# Patient Record
Sex: Female | Born: 1960 | Race: White | Hispanic: No | Marital: Married | Smoking: Former smoker
Health system: Southern US, Community
[De-identification: ages and names within clinical notes are randomized; demographics above are authoritative.]

## PROBLEM LIST (undated history)

## (undated) DIAGNOSIS — D649 Anemia, unspecified: Secondary | ICD-10-CM

## (undated) DIAGNOSIS — T8859XA Other complications of anesthesia, initial encounter: Secondary | ICD-10-CM

## (undated) DIAGNOSIS — R112 Nausea with vomiting, unspecified: Secondary | ICD-10-CM

## (undated) DIAGNOSIS — T4145XA Adverse effect of unspecified anesthetic, initial encounter: Secondary | ICD-10-CM

## (undated) DIAGNOSIS — R1900 Intra-abdominal and pelvic swelling, mass and lump, unspecified site: Secondary | ICD-10-CM

## (undated) DIAGNOSIS — K631 Perforation of intestine (nontraumatic): Secondary | ICD-10-CM

## (undated) DIAGNOSIS — Z9889 Other specified postprocedural states: Secondary | ICD-10-CM

## (undated) DIAGNOSIS — K432 Incisional hernia without obstruction or gangrene: Secondary | ICD-10-CM

## (undated) DIAGNOSIS — R634 Abnormal weight loss: Secondary | ICD-10-CM

## (undated) DIAGNOSIS — IMO0001 Reserved for inherently not codable concepts without codable children: Secondary | ICD-10-CM

## (undated) DIAGNOSIS — E785 Hyperlipidemia, unspecified: Secondary | ICD-10-CM

## (undated) DIAGNOSIS — C801 Malignant (primary) neoplasm, unspecified: Secondary | ICD-10-CM

## (undated) DIAGNOSIS — Z5189 Encounter for other specified aftercare: Secondary | ICD-10-CM

## (undated) HISTORY — DX: Hyperlipidemia, unspecified: E78.5

## (undated) HISTORY — PX: APPENDECTOMY: SHX54

## (undated) HISTORY — DX: Incisional hernia without obstruction or gangrene: K43.2

## (undated) HISTORY — DX: Perforation of intestine (nontraumatic): K63.1

## (undated) HISTORY — DX: Malignant (primary) neoplasm, unspecified: C80.1

## (undated) HISTORY — DX: Intra-abdominal and pelvic swelling, mass and lump, unspecified site: R19.00

## (undated) HISTORY — PX: TONSILLECTOMY: SUR1361

## (undated) HISTORY — DX: Abnormal weight loss: R63.4

## (undated) HISTORY — PX: CHOLECYSTECTOMY: SHX55

## (undated) HISTORY — PX: MELANOMA EXCISION: SHX5266

---

## 1984-12-19 HISTORY — PX: TUBAL LIGATION: SHX77

## 1995-12-20 HISTORY — PX: OTHER SURGICAL HISTORY: SHX169

## 2005-12-02 ENCOUNTER — Other Ambulatory Visit: Admission: RE | Admit: 2005-12-02 | Discharge: 2005-12-02 | Payer: Self-pay | Admitting: Obstetrics and Gynecology

## 2007-02-22 ENCOUNTER — Ambulatory Visit: Payer: Self-pay | Admitting: Cardiology

## 2010-12-02 ENCOUNTER — Ambulatory Visit (HOSPITAL_COMMUNITY)
Admission: RE | Admit: 2010-12-02 | Discharge: 2010-12-02 | Payer: Self-pay | Source: Home / Self Care | Attending: Obstetrics & Gynecology | Admitting: Obstetrics & Gynecology

## 2010-12-08 ENCOUNTER — Ambulatory Visit (HOSPITAL_COMMUNITY)
Admission: RE | Admit: 2010-12-08 | Discharge: 2010-12-08 | Payer: Self-pay | Source: Home / Self Care | Attending: Gastroenterology | Admitting: Gastroenterology

## 2010-12-16 ENCOUNTER — Ambulatory Visit (HOSPITAL_COMMUNITY)
Admission: RE | Admit: 2010-12-16 | Discharge: 2010-12-16 | Payer: Self-pay | Source: Home / Self Care | Attending: General Surgery | Admitting: General Surgery

## 2011-02-28 LAB — LIPID PANEL
LDL Cholesterol: 143 mg/dL — ABNORMAL HIGH (ref 0–99)
Total CHOL/HDL Ratio: 4.1 RATIO
Triglycerides: 111 mg/dL (ref <150)
VLDL: 22 mg/dL (ref 0–40)

## 2011-02-28 LAB — DIFFERENTIAL
Eosinophils Absolute: 0.1 10*3/uL (ref 0.0–0.7)
Eosinophils Relative: 3 % (ref 0–5)
Lymphs Abs: 1.5 10*3/uL (ref 0.7–4.0)
Monocytes Relative: 12 % (ref 3–12)

## 2011-02-28 LAB — CBC
MCH: 29.9 pg (ref 26.0–34.0)
MCHC: 33.6 g/dL (ref 30.0–36.0)
Platelets: 262 10*3/uL (ref 150–400)
RBC: 4.41 MIL/uL (ref 3.87–5.11)
RDW: 12.6 % (ref 11.5–15.5)

## 2011-02-28 LAB — TYPE AND SCREEN: Antibody Screen: NEGATIVE

## 2011-02-28 LAB — COMPREHENSIVE METABOLIC PANEL
ALT: 42 U/L — ABNORMAL HIGH (ref 0–35)
AST: 117 U/L — ABNORMAL HIGH (ref 0–37)
AST: 25 U/L (ref 0–37)
Albumin: 4.3 g/dL (ref 3.5–5.2)
Calcium: 9.6 mg/dL (ref 8.4–10.5)
Calcium: 9.5 mg/dL (ref 8.4–10.5)
Chloride: 106 mEq/L (ref 96–112)
Creatinine, Ser: 0.89 mg/dL (ref 0.4–1.2)
GFR calc Af Amer: >60 mL/min (ref >60)
GFR calc Af Amer: >60 mL/min (ref >60)
Sodium: 140 mEq/L (ref 135–145)
Sodium: 140 mEq/L (ref 135–145)
Total Bilirubin: 0.8 mg/dL (ref 0.3–1.2)
Total Protein: 6.8 g/dL (ref 6.0–8.3)

## 2011-02-28 LAB — AMYLASE: Amylase: 52 U/L (ref 0–105)

## 2011-02-28 LAB — SURGICAL PCR SCREEN: MRSA, PCR: NEGATIVE

## 2011-02-28 LAB — PROTIME-INR: INR: 1.02 (ref 0.00–1.49)

## 2011-05-06 NOTE — Assessment & Plan Note (Signed)
[facility]                            CARDIOLOGY OFFICE NOTE   [name], Clayton                     MRN:          [id number]  DATE:02/22/2007                            DOB:          May 06, 1961    I was asked by Dr. Marcelle Overlie to evaluate Stacie Clayton, a delightful 50-  year-old married white female, mother of 4, all younger than 30, for  history of hyperlipidemia.   She was evaluated in 2005 by Dr. Oneta Rack in [place], [place].  Her  cholesterol at that time was 271, triglycerides 155, HDL 47, LDL was  193.  She had a stress echo, which her husband and her remember as being  normal.   She had a recent lipo science profile by Dr. Marcelle Overlie, which showed her  particles to be borderline high at 1347.  Her small LDL particles were  low at 385.  LDL particle size was large.  Her large HDL was low risk  level, her total cholesterol was 222.  Her LDL was 148.  Her HDL was 55.  Her triglycerides were 97.   Her other risk factors are sedentary lifestyle and obesity.  She is a  remote smoker, quit in 1985.  She has no history of any other risk  factors.   FAMILY HISTORY:  Is really not a factor she thought, because her father  had his 1st MI at 35.  I explained that to her today.   She is currently totally asymptomatic.  She walks on occasion.   PAST MEDICAL HISTORY:  She has had a tubal ligation in 1986, a tubal  reversal in 1995.   CURRENT MEDICATIONS:  1. Celexa 40 mg a day.  2. Angelia hormone daily.  3. Aspirin 81 mg a day.   SHE HAS NO KNOWN DRUG ALLERGIES.   SOCIAL HISTORY:  She is married.  She has 4 children.  Her husband is  with her today.  Her oldest child is 42!   REVIEW OF SYSTEMS:  Fully questioned, and is positive only for history  of premature atrial beats in the past, occasional headaches.  Otherwise  negative.   She does give a history of malignant melanoma on her back in 1999.  She  is postmenopausal.   EXAMINATION:   She is very pleasant.  Her blood pressure is 126/79.  Her pulse is 64 and regular.  She is 5  feet 2 inches.  Weighs 195 pounds.  HEENT:  Normocephalic and atraumatic.  PERRLA.  Extraocular movements  intact.  Sclerae are clear.  Facial symmetry is normal.  Dentition is  satisfactory.  Carotid upstrokes are equal bilaterally without bruits.  There is no  JVD.  Thyroid is not enlarged.  Trachea is midline.  LUNGS:  Clear.  HEART:  Reveals a soft S1 and S2.  PMI could not be appreciated.  ABDOMINAL EXAM:  Soft with good bowel sounds.  No obvious organomegaly.  EXTREMITIES:  No edema.  Pulses are brisk.  NEUROLOGIC:  Exam in intact.  SKIN:  Unremarkable.  MUSCULOSKELETAL:  Unremarkable.   Electrocardiogram is normal.  ASSESSMENT AND PLAN:  I had about a 20-minute with Ms. Row and her  husband.  She clearly needs TLC or therapeutic lifestyle changes.  I  have recommended she start walking 3 hours per week, lose 4 pounds a  month, and see [place] back in 6 months.  At this time, she does not need  pharmacological therapy.  I am a little disconcerted by the fact there  is a significant discrepancy between her lipids in the past and now.  We  will perform a lipid profile in 6 months as a tiebreaker.     Thomas C. Daleen Squibb, MD, [facility]  Electronically Signed    TCW/MedQ  DD: 02/22/2007  DT: 02/22/2007  Job #: [id number]   cc:   [facility]. Marcelle Overlie, M.D.

## 2011-05-30 ENCOUNTER — Other Ambulatory Visit (INDEPENDENT_AMBULATORY_CARE_PROVIDER_SITE_OTHER): Payer: Self-pay | Admitting: General Surgery

## 2011-05-30 ENCOUNTER — Ambulatory Visit
Admission: RE | Admit: 2011-05-30 | Discharge: 2011-05-30 | Disposition: A | Discharge status: Home / Self Care | Payer: BC Managed Care – PPO | Source: Ambulatory Visit | Attending: General Surgery | Admitting: General Surgery

## 2011-05-30 DIAGNOSIS — R1013 Epigastric pain: Secondary | ICD-10-CM

## 2011-05-30 LAB — COMPREHENSIVE METABOLIC PANEL
Alkaline Phosphatase: 93 U/L (ref 39–117)
BUN: 16 mg/dL (ref 6–23)
Creat: 0.79 mg/dL (ref 0.50–1.10)
Glucose, Bld: 88 mg/dL (ref 70–99)
Sodium: 141 mEq/L (ref 135–145)
Total Bilirubin: 1.4 mg/dL — ABNORMAL HIGH (ref 0.3–1.2)

## 2011-05-30 LAB — CBC WITH DIFFERENTIAL/PLATELET
Basophils Relative: 1 % (ref 0–1)
Eosinophils Absolute: 0.1 10*3/uL (ref 0.0–0.7)
Eosinophils Relative: 3 % (ref 0–5)
Hemoglobin: 12.9 g/dL (ref 12.0–15.0)
MCH: 30.4 pg (ref 26.0–34.0)
MCHC: 33.5 g/dL (ref 30.0–36.0)
Monocytes Absolute: 0.4 10*3/uL (ref 0.1–1.0)
Monocytes Relative: 9 % (ref 3–12)
Neutrophils Relative %: 60 % (ref 43–77)

## 2011-05-30 LAB — LIPASE: Lipase: 57 U/L (ref 0–75)

## 2011-05-30 MED ORDER — IOHEXOL 300 MG/ML  SOLN: 100.0000 mL | Freq: Once | INTRAMUSCULAR | Status: AC | PRN

## 2011-06-13 ENCOUNTER — Ambulatory Visit (HOSPITAL_COMMUNITY)
Admission: RE | Admit: 2011-06-13 | Discharge: 2011-06-13 | Disposition: A | Discharge status: Home / Self Care | Payer: BC Managed Care – PPO | Source: Ambulatory Visit | Attending: Gastroenterology | Admitting: Gastroenterology

## 2011-06-13 DIAGNOSIS — Z01812 Encounter for preprocedural laboratory examination: Secondary | ICD-10-CM | POA: Insufficient documentation

## 2011-06-13 LAB — TYPE AND SCREEN
ABO/RH(D): A NEG
Antibody Screen: NEGATIVE

## 2011-06-13 LAB — CBC
Platelets: 231 10*3/uL (ref 150–400)
RBC: 4.42 MIL/uL (ref 3.87–5.11)
RDW: 12.5 % (ref 11.5–15.5)
WBC: 5 10*3/uL (ref 4.0–10.5)

## 2011-06-13 LAB — COMPREHENSIVE METABOLIC PANEL
Albumin: 4.2 g/dL (ref 3.5–5.2)
BUN: 15 mg/dL (ref 6–23)
Calcium: 10.8 mg/dL — ABNORMAL HIGH (ref 8.4–10.5)
GFR calc Af Amer: >60 mL/min (ref >60)
Glucose, Bld: 93 mg/dL (ref 70–99)
Sodium: 142 mEq/L (ref 135–145)
Total Protein: 7.4 g/dL (ref 6.0–8.3)

## 2011-06-13 LAB — DIFFERENTIAL
Basophils Relative: 1 % (ref 0–1)
Eosinophils Absolute: 0.2 10*3/uL (ref 0.0–0.7)
Eosinophils Relative: 3 % (ref 0–5)
Neutrophils Relative %: 57 % (ref 43–77)

## 2011-06-14 ENCOUNTER — Ambulatory Visit (HOSPITAL_COMMUNITY)
Admission: RE | Admit: 2011-06-14 | Discharge: 2011-06-14 | Disposition: A | Discharge status: Home / Self Care | Payer: BC Managed Care – PPO | Source: Ambulatory Visit | Attending: Gastroenterology | Admitting: Gastroenterology

## 2011-06-14 ENCOUNTER — Ambulatory Visit (HOSPITAL_COMMUNITY): Payer: BC Managed Care – PPO

## 2011-06-14 DIAGNOSIS — Z7902 Long term (current) use of antithrombotics/antiplatelets: Secondary | ICD-10-CM | POA: Insufficient documentation

## 2011-06-14 DIAGNOSIS — R1013 Epigastric pain: Secondary | ICD-10-CM | POA: Insufficient documentation

## 2011-06-14 DIAGNOSIS — Z9089 Acquired absence of other organs: Secondary | ICD-10-CM | POA: Insufficient documentation

## 2011-06-14 DIAGNOSIS — Z01812 Encounter for preprocedural laboratory examination: Secondary | ICD-10-CM | POA: Insufficient documentation

## 2011-06-14 DIAGNOSIS — Z79899 Other long term (current) drug therapy: Secondary | ICD-10-CM | POA: Insufficient documentation

## 2011-06-14 DIAGNOSIS — R109 Unspecified abdominal pain: Secondary | ICD-10-CM | POA: Insufficient documentation

## 2011-06-14 HISTORY — PX: ERCP W/ SPHICTEROTOMY: SHX1523

## 2011-06-19 ENCOUNTER — Inpatient Hospital Stay (HOSPITAL_COMMUNITY)
Admission: EM | Admit: 2011-06-19 | Discharge: 2011-07-10 | DRG: 583 | Disposition: A | Discharge status: Home / Self Care | Payer: BC Managed Care – PPO | Attending: Surgery | Admitting: Surgery

## 2011-06-19 DIAGNOSIS — D649 Anemia, unspecified: Secondary | ICD-10-CM | POA: Diagnosis present

## 2011-06-19 DIAGNOSIS — E46 Unspecified protein-calorie malnutrition: Secondary | ICD-10-CM | POA: Diagnosis present

## 2011-06-19 DIAGNOSIS — Y9210 Unspecified residential institution as the place of occurrence of the external cause: Secondary | ICD-10-CM | POA: Diagnosis present

## 2011-06-19 DIAGNOSIS — E785 Hyperlipidemia, unspecified: Secondary | ICD-10-CM | POA: Diagnosis present

## 2011-06-19 DIAGNOSIS — K560 Paralytic ileus: Secondary | ICD-10-CM | POA: Diagnosis present

## 2011-06-19 DIAGNOSIS — K631 Perforation of intestine (nontraumatic): Secondary | ICD-10-CM | POA: Diagnosis present

## 2011-06-19 DIAGNOSIS — IMO0002 Reserved for concepts with insufficient information to code with codable children: Principal | ICD-10-CM | POA: Diagnosis present

## 2011-06-19 DIAGNOSIS — K651 Peritoneal abscess: Secondary | ICD-10-CM | POA: Diagnosis present

## 2011-06-19 DIAGNOSIS — E876 Hypokalemia: Secondary | ICD-10-CM | POA: Diagnosis not present

## 2011-06-19 HISTORY — PX: OTHER SURGICAL HISTORY: SHX169

## 2011-06-20 ENCOUNTER — Emergency Department (HOSPITAL_COMMUNITY): Payer: BC Managed Care – PPO

## 2011-06-20 ENCOUNTER — Encounter (HOSPITAL_COMMUNITY): Payer: Self-pay | Admitting: Radiology

## 2011-06-20 DIAGNOSIS — K269 Duodenal ulcer, unspecified as acute or chronic, without hemorrhage or perforation: Secondary | ICD-10-CM

## 2011-06-20 LAB — COMPREHENSIVE METABOLIC PANEL
ALT: 21 U/L (ref 0–35)
Alkaline Phosphatase: 65 U/L (ref 39–117)
CO2: 25 mEq/L (ref 19–32)
Chloride: 93 mEq/L — ABNORMAL LOW (ref 96–112)
GFR calc Af Amer: >60 mL/min (ref >60)
GFR calc non Af Amer: >60 mL/min (ref >60)
Glucose, Bld: 133 mg/dL — ABNORMAL HIGH (ref 70–99)
Potassium: 2.9 mEq/L — ABNORMAL LOW (ref 3.5–5.1)
Sodium: 132 mEq/L — ABNORMAL LOW (ref 135–145)
Total Bilirubin: 0.5 mg/dL (ref 0.3–1.2)
Total Protein: 5.9 g/dL — ABNORMAL LOW (ref 6.0–8.3)

## 2011-06-20 LAB — PROTIME-INR
INR: 1.25 (ref 0.00–1.49)
Prothrombin Time: 16 seconds — ABNORMAL HIGH (ref 11.6–15.2)

## 2011-06-20 LAB — CBC
Hemoglobin: 12.5 g/dL (ref 12.0–15.0)
RBC: 4.07 MIL/uL (ref 3.87–5.11)
WBC: 16.3 10*3/uL — ABNORMAL HIGH (ref 4.0–10.5)

## 2011-06-20 LAB — DIFFERENTIAL
Basophils Relative: 1 % (ref 0–1)
Eosinophils Relative: 0 % (ref 0–5)
Monocytes Relative: 10 % (ref 3–12)
Neutrophils Relative %: 86 % — ABNORMAL HIGH (ref 43–77)
WBC Morphology: INCREASED

## 2011-06-20 MED ORDER — IOHEXOL 300 MG/ML  SOLN
100.0000 mL | Freq: Once | INTRAMUSCULAR | Status: AC | PRN
  Administered 2011-06-20: 100 mL via INTRAVENOUS

## 2011-06-21 LAB — BASIC METABOLIC PANEL
BUN: 13 mg/dL (ref 6–23)
Calcium: 7.6 mg/dL — ABNORMAL LOW (ref 8.4–10.5)
GFR calc non Af Amer: >60 mL/min (ref >60)
Glucose, Bld: 131 mg/dL — ABNORMAL HIGH (ref 70–99)

## 2011-06-21 LAB — CBC
HCT: 26.4 % — ABNORMAL LOW (ref 36.0–46.0)
Hemoglobin: 9 g/dL — ABNORMAL LOW (ref 12.0–15.0)
MCH: 29.7 pg (ref 26.0–34.0)
MCHC: 34.1 g/dL (ref 30.0–36.0)
MCV: 87.1 fL (ref 78.0–100.0)

## 2011-06-22 ENCOUNTER — Inpatient Hospital Stay (HOSPITAL_COMMUNITY): Payer: BC Managed Care – PPO

## 2011-06-22 LAB — BASIC METABOLIC PANEL
BUN: 17 mg/dL (ref 6–23)
BUN: 18 mg/dL (ref 6–23)
Calcium: 7.3 mg/dL — ABNORMAL LOW (ref 8.4–10.5)
Calcium: 7.3 mg/dL — ABNORMAL LOW (ref 8.4–10.5)
Chloride: 103 mEq/L (ref 96–112)
Creatinine, Ser: <0.47 mg/dL — ABNORMAL LOW (ref 0.50–1.10)
Glucose, Bld: 129 mg/dL — ABNORMAL HIGH (ref 70–99)
Glucose, Bld: 260 mg/dL — ABNORMAL HIGH (ref 70–99)
Potassium: 7.4 mEq/L (ref 3.5–5.1)
Potassium: 3.7 mEq/L (ref 3.5–5.1)
Sodium: 135 mEq/L (ref 135–145)
Sodium: 132 mEq/L — ABNORMAL LOW (ref 135–145)

## 2011-06-22 LAB — GLUCOSE, CAPILLARY: Glucose-Capillary: 122 mg/dL — ABNORMAL HIGH (ref 70–99)

## 2011-06-22 LAB — CBC
HCT: 18.6 % — ABNORMAL LOW (ref 36.0–46.0)
HCT: 19.8 % — ABNORMAL LOW (ref 36.0–46.0)
Hemoglobin: 6.1 g/dL — CL (ref 12.0–15.0)
Hemoglobin: 7.1 g/dL — ABNORMAL LOW (ref 12.0–15.0)
Hemoglobin: 7.4 g/dL — ABNORMAL LOW (ref 12.0–15.0)
MCH: 30.5 pg (ref 26.0–34.0)
MCH: 31.2 pg (ref 26.0–34.0)
MCHC: 33.9 g/dL (ref 30.0–36.0)
MCHC: 34.3 g/dL (ref 30.0–36.0)
MCHC: 35.9 g/dL (ref 30.0–36.0)
MCV: 89.4 fL (ref 78.0–100.0)
MCV: 89 fL (ref 78.0–100.0)
Platelets: 319 10*3/uL (ref 150–400)
RDW: 14.3 % (ref 11.5–15.5)
RDW: 14.6 % (ref 11.5–15.5)
RDW: 14.7 % (ref 11.5–15.5)
WBC: 21.5 10*3/uL — ABNORMAL HIGH (ref 4.0–10.5)
WBC: 22.2 10*3/uL — ABNORMAL HIGH (ref 4.0–10.5)

## 2011-06-22 LAB — BODY FLUID CULTURE

## 2011-06-22 LAB — MAGNESIUM: Magnesium: 1.8 mg/dL (ref 1.5–2.5)

## 2011-06-22 LAB — PHOSPHORUS
Phosphorus: 1.4 mg/dL — ABNORMAL LOW (ref 2.3–4.6)
Phosphorus: 5.7 mg/dL — ABNORMAL HIGH (ref 2.3–4.6)

## 2011-06-23 ENCOUNTER — Inpatient Hospital Stay (HOSPITAL_COMMUNITY): Payer: BC Managed Care – PPO

## 2011-06-23 LAB — GLUCOSE, CAPILLARY
Glucose-Capillary: 102 mg/dL — ABNORMAL HIGH (ref 70–99)
Glucose-Capillary: 94 mg/dL (ref 70–99)

## 2011-06-23 LAB — COMPREHENSIVE METABOLIC PANEL
AST: 27 U/L (ref 0–37)
Albumin: 1.4 g/dL — ABNORMAL LOW (ref 3.5–5.2)
Alkaline Phosphatase: 94 U/L (ref 39–117)
BUN: 13 mg/dL (ref 6–23)
CO2: 28 mEq/L (ref 19–32)
Chloride: 101 mEq/L (ref 96–112)
Creatinine, Ser: <0.47 mg/dL — ABNORMAL LOW (ref 0.50–1.10)
Potassium: 3.2 mEq/L — ABNORMAL LOW (ref 3.5–5.1)
Total Bilirubin: 0.4 mg/dL (ref 0.3–1.2)

## 2011-06-23 LAB — CBC
MCH: 31.2 pg (ref 26.0–34.0)
MCHC: 35.1 g/dL (ref 30.0–36.0)
Platelets: 323 10*3/uL (ref 150–400)
Platelets: 267 10*3/uL (ref 150–400)
RBC: 3.14 MIL/uL — ABNORMAL LOW (ref 3.87–5.11)
RDW: 14.6 % (ref 11.5–15.5)
WBC: 21.9 10*3/uL — ABNORMAL HIGH (ref 4.0–10.5)
WBC: 17.8 10*3/uL — ABNORMAL HIGH (ref 4.0–10.5)

## 2011-06-23 LAB — DIFFERENTIAL
Basophils Absolute: 0.2 10*3/uL — ABNORMAL HIGH (ref 0.0–0.1)
Basophils Relative: 1 % (ref 0–1)
Eosinophils Absolute: 0.2 10*3/uL (ref 0.0–0.7)
Lymphocytes Relative: 8 % — ABNORMAL LOW (ref 12–46)
Monocytes Absolute: 0.9 10*3/uL (ref 0.1–1.0)
Neutrophils Relative %: 86 % — ABNORMAL HIGH (ref 43–77)

## 2011-06-23 LAB — TRIGLYCERIDES: Triglycerides: 174 mg/dL — ABNORMAL HIGH (ref <150)

## 2011-06-23 LAB — VANCOMYCIN, TROUGH: Vancomycin Tr: 6.1 ug/mL — ABNORMAL LOW (ref 10.0–20.0)

## 2011-06-23 LAB — PREALBUMIN: Prealbumin: 8.8 mg/dL — ABNORMAL LOW (ref 17.0–34.0)

## 2011-06-24 LAB — TYPE AND SCREEN
Unit division: 0
Unit division: 0

## 2011-06-24 LAB — COMPREHENSIVE METABOLIC PANEL
Albumin: 1.5 g/dL — ABNORMAL LOW (ref 3.5–5.2)
BUN: 9 mg/dL (ref 6–23)
CO2: 28 mEq/L (ref 19–32)
Chloride: 101 mEq/L (ref 96–112)
Creatinine, Ser: <0.47 mg/dL — ABNORMAL LOW (ref 0.50–1.10)
Total Bilirubin: 0.7 mg/dL (ref 0.3–1.2)

## 2011-06-24 LAB — CBC
HCT: 24.2 % — ABNORMAL LOW (ref 36.0–46.0)
MCHC: 36.4 g/dL — ABNORMAL HIGH (ref 30.0–36.0)
RDW: 14.8 % (ref 11.5–15.5)

## 2011-06-24 LAB — GLUCOSE, CAPILLARY
Glucose-Capillary: 115 mg/dL — ABNORMAL HIGH (ref 70–99)
Glucose-Capillary: 100 mg/dL — ABNORMAL HIGH (ref 70–99)

## 2011-06-24 LAB — PREPARE FRESH FROZEN PLASMA

## 2011-06-24 LAB — PROTIME-INR: INR: 1.1 (ref 0.00–1.49)

## 2011-06-24 LAB — PHOSPHORUS: Phosphorus: 2.8 mg/dL (ref 2.3–4.6)

## 2011-06-24 LAB — MAGNESIUM: Magnesium: 1.9 mg/dL (ref 1.5–2.5)

## 2011-06-25 LAB — BASIC METABOLIC PANEL
Chloride: 102 mEq/L (ref 96–112)
Creatinine, Ser: <0.47 mg/dL — ABNORMAL LOW (ref 0.50–1.10)
Potassium: 3.4 mEq/L — ABNORMAL LOW (ref 3.5–5.1)

## 2011-06-25 LAB — GLUCOSE, CAPILLARY
Glucose-Capillary: 106 mg/dL — ABNORMAL HIGH (ref 70–99)
Glucose-Capillary: 99 mg/dL (ref 70–99)

## 2011-06-25 LAB — ANAEROBIC CULTURE

## 2011-06-25 LAB — CBC
Platelets: 280 10*3/uL (ref 150–400)
RDW: 14.7 % (ref 11.5–15.5)
WBC: 14.5 10*3/uL — ABNORMAL HIGH (ref 4.0–10.5)

## 2011-06-25 LAB — VANCOMYCIN, TROUGH: Vancomycin Tr: 13.9 ug/mL (ref 10.0–20.0)

## 2011-06-26 LAB — BASIC METABOLIC PANEL
Chloride: 101 mEq/L (ref 96–112)
Creatinine, Ser: <0.47 mg/dL — ABNORMAL LOW (ref 0.50–1.10)

## 2011-06-26 LAB — CBC
MCHC: 34.3 g/dL (ref 30.0–36.0)
MCV: 87.7 fL (ref 78.0–100.0)
Platelets: 346 10*3/uL (ref 150–400)
RDW: 14.4 % (ref 11.5–15.5)
WBC: 14.4 10*3/uL — ABNORMAL HIGH (ref 4.0–10.5)

## 2011-06-27 ENCOUNTER — Inpatient Hospital Stay (HOSPITAL_COMMUNITY): Payer: BC Managed Care – PPO

## 2011-06-27 DIAGNOSIS — E876 Hypokalemia: Secondary | ICD-10-CM

## 2011-06-27 LAB — DIFFERENTIAL
Basophils Absolute: 0 10*3/uL (ref 0.0–0.1)
Eosinophils Absolute: 0.3 10*3/uL (ref 0.0–0.7)
Eosinophils Relative: 2 % (ref 0–5)
Monocytes Absolute: 0.1 10*3/uL (ref 0.1–1.0)
Neutrophils Relative %: 84 % — ABNORMAL HIGH (ref 43–77)

## 2011-06-27 LAB — COMPREHENSIVE METABOLIC PANEL
ALT: 31 U/L (ref 0–35)
Albumin: 1.6 g/dL — ABNORMAL LOW (ref 3.5–5.2)
Alkaline Phosphatase: 182 U/L — ABNORMAL HIGH (ref 39–117)
Calcium: 7.9 mg/dL — ABNORMAL LOW (ref 8.4–10.5)
Potassium: 3.2 mEq/L — ABNORMAL LOW (ref 3.5–5.1)
Sodium: 137 mEq/L (ref 135–145)
Total Protein: 5 g/dL — ABNORMAL LOW (ref 6.0–8.3)

## 2011-06-27 LAB — CBC
Hemoglobin: 8.1 g/dL — ABNORMAL LOW (ref 12.0–15.0)
MCH: 30.5 pg (ref 26.0–34.0)
MCHC: 34 g/dL (ref 30.0–36.0)
Platelets: 452 10*3/uL — ABNORMAL HIGH (ref 150–400)
RDW: 14.4 % (ref 11.5–15.5)

## 2011-06-27 MED ORDER — IOHEXOL 300 MG/ML  SOLN
240.0000 mL | Freq: Once | INTRAMUSCULAR | Status: AC | PRN
  Administered 2011-06-27: 240 mL

## 2011-06-27 MED ORDER — IOHEXOL 300 MG/ML  SOLN
80.0000 mL | Freq: Once | INTRAMUSCULAR | Status: AC | PRN
  Administered 2011-06-27: 80 mL via INTRAVENOUS

## 2011-06-28 ENCOUNTER — Inpatient Hospital Stay (HOSPITAL_COMMUNITY): Payer: BC Managed Care – PPO

## 2011-06-28 DIAGNOSIS — E46 Unspecified protein-calorie malnutrition: Secondary | ICD-10-CM

## 2011-06-28 LAB — CBC
HCT: 24.9 % — ABNORMAL LOW (ref 36.0–46.0)
MCV: 89.2 fL (ref 78.0–100.0)
RDW: 14.1 % (ref 11.5–15.5)
WBC: 15.6 10*3/uL — ABNORMAL HIGH (ref 4.0–10.5)

## 2011-06-28 LAB — BASIC METABOLIC PANEL
BUN: 7 mg/dL (ref 6–23)
CO2: 27 mEq/L (ref 19–32)
Chloride: 99 mEq/L (ref 96–112)
Creatinine, Ser: <0.47 mg/dL — ABNORMAL LOW (ref 0.50–1.10)

## 2011-06-28 NOTE — H&P (Signed)
Stacie Clayton, Stacie Clayton              ACCOUNT NO.:  [id number]  MEDICAL RECORD NO.:  [id number]  LOCATION:  2306                         FACILITY:  [facility]  PHYSICIAN:  Almond Lint, MD       DATE OF BIRTH:  Aug 22, 1961  DATE OF ADMISSION:  06/19/2011 DATE OF DISCHARGE:  06/20/2011                             HISTORY & PHYSICAL   CHIEF COMPLAINT:  Duodenal perforation.  HISTORY OF PRESENT ILLNESS:  Ms.  Clayton is a 50 year old female who had a laparoscopic cholecystectomy back in December.  Since that time she has been having several episodes of right upper quadrant pain.  Dr. Dwain Sarna, primary surgeon.  They discussed everything with Dr. Ewing Schlein on doing ERCP on Tuesday.  She started having some pain after the ERCP on Tuesday and saw Dr. Ewing Schlein on Wednesday.  The plan had been if the pain did not go away by Thursday, to get a CT scan but by Thursday morning she was feeling a lot better.  Then, Thursday night the pain started back and begriming slowly worsening over the weekend.  The husband's anesthesiologist on-call Saturday to Sunday morning.  She does not want to good emergency department for left, at that time she got back, she was feeling a lot worse.  Last night she just came to the emergency department.  The patient is quite ill.  She has not had nausea, vomiting through this time, but has had some diarrhea.  She has not had anything to relieve the pain.  She did try taking some Vicodin and she has had from left over, and this did not relieve the pain.  The pain is worse when she moves.  She denies any other symptoms.  She denies fevers or chills.  She denies other symptoms.  PAST MEDICAL HISTORY:  Significant for no chronic illnesses.  SURGICAL HISTORY:  Laparoscopic cholecystectomy in December 2011, tubal ligation and tubal ligation reversal.  FAMILY HISTORY:  Noncontributory.  SOCIAL HISTORY:  She does not use tobacco, alcohol, or drugs.  She is married.  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  REVIEW OF SYSTEMS:  Otherwise negative x 11.  MEDICATIONS:  Alertec, calcium plus D, and Estrace.  ALLERGIES:  Known drug allergies.  PHYSICAL EXAMINATION:  VITAL SIGNS:  Temperature 98.6, heart rate 102, blood pressure 118/72, respiratory rate 21, and sats 96%. HEENT:  She is normocephalic and atraumatic.  Sclerae are anicteric. NECK:  Supple.  No lymphadenopathy.  No thyromegaly. HEART:  Regular rhythm.  She is tachycardic.  No murmurs, rubs, or gallops. LUNGS:  Clear to auscultation bilaterally. ABDOMEN:  Soft, distended.  Bowel sounds present.  She has tender right side greater than left with no guarding, but she has gotten significant amount of pain medication.  MUSCULOSKELETAL:  No gross deformities. SKIN:  She looks pale. NEURO:  She is very sleepy. PSYCH:  Mood, affect, and decision making appear normal.  LABORATORY DATA:  White count 16.3, hemoglobin 12.5, hematocrit 35.4, platelet count of 144,000.  Sodium 132, potassium 2.9, chloride 93, CO2 of 25, BUN 14, creatinine 0.59, glucose 133, bilirubin 0.5, AST 22, ALT 21, alk phos 65, and albumin 2.1.  CT scan significant for gas and  fluid around her kidney and retroperitoneum and some free air included in the peritoneal cavity, it was appears to have a small bowel obstruction with significantly dilated loops of bowel in the upper abdomen.  IMPRESSION:  Stacie Clayton is a 50 year old female with duodenal perforation after an ERCP, also with SIRS, given IV fluids, IV antibiotics, OR for exploratory laparotomy in her bowel perforation, she will be n.p.o., typed and screened, and coags.  Surgery was discussed with the patient and her husband and they agree to proceed.     Almond Lint, MD     FB/MEDQ  D:  06/20/2011  T:  06/20/2011  Job:  [id number]  Electronically Signed by Almond Lint MD on 06/28/2011 02:04:01 PM

## 2011-06-28 NOTE — Op Note (Signed)
[name], OZBUN              ACCOUNT NO.:  [id number]  MEDICAL RECORD NO.:  [id number]  LOCATION:  2306                         FACILITY:  [facility]  PHYSICIAN:  Almond Lint, MD       DATE OF BIRTH:  1961-11-20  DATE OF PROCEDURE:  06/20/2011 DATE OF DISCHARGE:  06/20/2011                              OPERATIVE REPORT   PREOPERATIVE DIAGNOSIS:  Duodenal perforation.  POSTOPERATIVE DIAGNOSIS:  Duodenal perforation.  PROCEDURE:  Exploratory laparotomy and repair/patch of duodenal perforation.  SURGEON:  Almond Lint, MD  ASSISTANT:  Troy Sine. Dwain Sarna, MD  ANESTHESIA:  General and local.  FINDINGS:  Retroperitoneal perforation of the duodenum with bile and succus and an abscess behind the cecum.  The abscess broke through the retroperitoneum and the right lower quadrant with extension of fluid into the pelvis.  SPECIMEN:  Cultures to Microbiology.  ESTIMATED BLOOD LOSS:  250 mL.  COMPLICATIONS:  None known.  INDICATIONS:  Stacie Clayton is a 50 year old female who had a laparoscopic cholecystectomy back in December 2011.  She has had intermittent episodes of right upper quadrant pain.  She developed several severe episodes of right upper quadrant pain and underwent an EGD 5 days prior to admission.  She has pain that night which resolved over the next 48 hours.  The pain recurred over the weekend and got to where it was so severe that she was brought to the emergency department.  CT scan confirmed leakage of contrast and air in the retroperitoneum and the pelvis and she was taken to the OR emergently for exploratory laparotomy.  PROCEDURE IN DETAIL:  [name] was identified in the holding area and taken to the operating room where she was placed supine on the operating room table.  General anesthesia was induced.  NG tube and Foley catheter were placed.  She had 2 L of bilious output from her stomach.  Her abdomen was prepped and draped in sterile fashion.   Time-out was performed according to surgical safety check list.  When all was correct, we continued.  A midline incision was made in the supraumbilical location.  The incision was carried out through the subcutaneous tissues with the cautery.  The midline was incised with the cautery and fascial incision was extended to the length of the incision. Immediately peritoneally, we needed larger incisions, so this was continued with the knife below the umbilicus.  Cautery was used to extend out the incision as well.  The Bookwalter retractor was placed for assistance with visualization.  There was bilious material pouring out of what appeared to be the cecum initially.  Once the cecum and small bowel was freed up, it was apparent that the bile was coming out of the retroperitoneum and that the cecum was intact.  The bowel was freed up from the pelvis where there were multiple adhesions.  The bowel was run and there was no evidence of other bowel injuries.  At this point, the duodenum was Kocherized.  There was some bleeding from one of the jejunal branch into the SMV which was oversewn.  The hole was found in the posterior duodenum and was approximately 1 cm in diameter.  The edges  were not clean.  Four 2-0 silk sutures were placed across the defect in the duodenum.  Through these, we were able to tie down but one of them was bridging through a speck of a gap and the duodenum looked quite friable, so this one was not tied down.  A tightened omentum was laid over the top of this and this was tied down over the hole.  The abdomen was then copiously irrigated.  There were 16 L of saline used to irrigate the abdomen.  Two #19 Blake drains were placed, one in the retroduodenal location and one in the right paracolic gutter.  This extends down into the pelvis.  The fascia was then closed using running #1 looped PDS sutures.  The umbilicus was stapled together and then a wound VAC was placed in the  remainder of the incision.  This was done after irrigating the skin.  The patient was awakened from anesthesia and taken to PACU in stable condition.  Needle, sponge, and instrument counts were correct x2.     Almond Lint, MD     FB/MEDQ  D:  06/20/2011  T:  06/20/2011  Job:  [id number]  Electronically Signed by Almond Lint MD on 06/28/2011 02:04:12 PM

## 2011-06-29 LAB — BASIC METABOLIC PANEL
BUN: 8 mg/dL (ref 6–23)
Calcium: 8.5 mg/dL (ref 8.4–10.5)
Creatinine, Ser: <0.47 mg/dL — ABNORMAL LOW (ref 0.50–1.10)

## 2011-06-29 LAB — CBC
HCT: 25 % — ABNORMAL LOW (ref 36.0–46.0)
MCH: 30.5 pg (ref 26.0–34.0)
MCHC: 34 g/dL (ref 30.0–36.0)
MCV: 89.6 fL (ref 78.0–100.0)
RDW: 14 % (ref 11.5–15.5)

## 2011-06-30 LAB — COMPREHENSIVE METABOLIC PANEL
AST: 14 U/L (ref 0–37)
Albumin: 2 g/dL — ABNORMAL LOW (ref 3.5–5.2)
BUN: 9 mg/dL (ref 6–23)
Calcium: 8.5 mg/dL (ref 8.4–10.5)
Creatinine, Ser: <0.47 mg/dL — ABNORMAL LOW (ref 0.50–1.10)

## 2011-06-30 LAB — MAGNESIUM: Magnesium: 2.2 mg/dL (ref 1.5–2.5)

## 2011-06-30 LAB — PHOSPHORUS: Phosphorus: 3.8 mg/dL (ref 2.3–4.6)

## 2011-07-02 LAB — COMPREHENSIVE METABOLIC PANEL
ALT: 16 U/L (ref 0–35)
AST: 14 U/L (ref 0–37)
Albumin: 2.1 g/dL — ABNORMAL LOW (ref 3.5–5.2)
CO2: 28 mEq/L (ref 19–32)
Calcium: 8.6 mg/dL (ref 8.4–10.5)
Chloride: 105 mEq/L (ref 96–112)
Sodium: 138 mEq/L (ref 135–145)

## 2011-07-03 ENCOUNTER — Inpatient Hospital Stay (HOSPITAL_COMMUNITY): Payer: BC Managed Care – PPO

## 2011-07-03 LAB — ANAEROBIC CULTURE

## 2011-07-03 MED ORDER — IOHEXOL 300 MG/ML  SOLN
100.0000 mL | Freq: Once | INTRAMUSCULAR | Status: AC | PRN
  Administered 2011-07-03: 100 mL via INTRAVENOUS

## 2011-07-04 LAB — DIFFERENTIAL
Basophils Absolute: 0.1 10*3/uL (ref 0.0–0.1)
Basophils Relative: 1 % (ref 0–1)
Eosinophils Absolute: 0.3 10*3/uL (ref 0.0–0.7)
Eosinophils Relative: 4 % (ref 0–5)
Lymphs Abs: 1.5 10*3/uL (ref 0.7–4.0)
Neutrophils Relative %: 61 % (ref 43–77)

## 2011-07-04 LAB — TRIGLYCERIDES: Triglycerides: 109 mg/dL (ref <150)

## 2011-07-04 LAB — COMPREHENSIVE METABOLIC PANEL
ALT: 13 U/L (ref 0–35)
AST: 12 U/L (ref 0–37)
Albumin: 2.1 g/dL — ABNORMAL LOW (ref 3.5–5.2)
CO2: 29 mEq/L (ref 19–32)
Chloride: 102 mEq/L (ref 96–112)
Creatinine, Ser: <0.47 mg/dL — ABNORMAL LOW (ref 0.50–1.10)
Potassium: 3.5 mEq/L (ref 3.5–5.1)
Sodium: 138 mEq/L (ref 135–145)
Total Bilirubin: 0.2 mg/dL — ABNORMAL LOW (ref 0.3–1.2)

## 2011-07-04 LAB — CBC
Platelets: 623 10*3/uL — ABNORMAL HIGH (ref 150–400)
RBC: 2.73 MIL/uL — ABNORMAL LOW (ref 3.87–5.11)
RDW: 13.9 % (ref 11.5–15.5)
WBC: 6.6 10*3/uL (ref 4.0–10.5)

## 2011-07-04 LAB — CHOLESTEROL, TOTAL: Cholesterol: 121 mg/dL (ref 0–200)

## 2011-07-04 LAB — MAGNESIUM: Magnesium: 2.1 mg/dL (ref 1.5–2.5)

## 2011-07-04 LAB — PREALBUMIN: Prealbumin: 14 mg/dL — ABNORMAL LOW (ref 17.0–34.0)

## 2011-07-04 LAB — PHOSPHORUS: Phosphorus: 3.7 mg/dL (ref 2.3–4.6)

## 2011-07-05 LAB — CBC
MCHC: 32.4 g/dL (ref 30.0–36.0)
Platelets: 606 10*3/uL — ABNORMAL HIGH (ref 150–400)
RDW: 13.8 % (ref 11.5–15.5)
WBC: 7.1 10*3/uL (ref 4.0–10.5)

## 2011-07-06 LAB — CULTURE, ROUTINE-ABSCESS

## 2011-07-06 LAB — COMPREHENSIVE METABOLIC PANEL
AST: 16 U/L (ref 0–37)
Albumin: 2.2 g/dL — ABNORMAL LOW (ref 3.5–5.2)
Alkaline Phosphatase: 85 U/L (ref 39–117)
BUN: 10 mg/dL (ref 6–23)
Chloride: 103 mEq/L (ref 96–112)
Potassium: 3.6 mEq/L (ref 3.5–5.1)
Total Bilirubin: 0.2 mg/dL — ABNORMAL LOW (ref 0.3–1.2)
Total Protein: 5.8 g/dL — ABNORMAL LOW (ref 6.0–8.3)

## 2011-07-07 LAB — COMPREHENSIVE METABOLIC PANEL
ALT: 9 U/L (ref 0–35)
Alkaline Phosphatase: 92 U/L (ref 39–117)
BUN: 11 mg/dL (ref 6–23)
CO2: 26 mEq/L (ref 19–32)
Calcium: 8.8 mg/dL (ref 8.4–10.5)
GFR calc Af Amer: >60 mL/min (ref >60)
GFR calc non Af Amer: >60 mL/min (ref >60)
Glucose, Bld: 106 mg/dL — ABNORMAL HIGH (ref 70–99)
Potassium: 3.6 mEq/L (ref 3.5–5.1)
Sodium: 138 mEq/L (ref 135–145)
Total Protein: 6.1 g/dL (ref 6.0–8.3)

## 2011-07-07 LAB — MAGNESIUM: Magnesium: 2 mg/dL (ref 1.5–2.5)

## 2011-07-08 ENCOUNTER — Inpatient Hospital Stay (HOSPITAL_COMMUNITY): Payer: BC Managed Care – PPO

## 2011-07-08 MED ORDER — IOHEXOL 300 MG/ML  SOLN
80.0000 mL | Freq: Once | INTRAMUSCULAR | Status: AC | PRN
  Administered 2011-07-08: 80 mL via INTRAVENOUS

## 2011-07-14 ENCOUNTER — Encounter (INDEPENDENT_AMBULATORY_CARE_PROVIDER_SITE_OTHER): Payer: Self-pay

## 2011-07-14 ENCOUNTER — Ambulatory Visit (INDEPENDENT_AMBULATORY_CARE_PROVIDER_SITE_OTHER): Payer: BC Managed Care – PPO | Admitting: General Surgery

## 2011-07-14 ENCOUNTER — Encounter (INDEPENDENT_AMBULATORY_CARE_PROVIDER_SITE_OTHER): Payer: Self-pay | Admitting: General Surgery

## 2011-07-14 VITALS — Temp 97.9°F

## 2011-07-14 DIAGNOSIS — K631 Perforation of intestine (nontraumatic): Secondary | ICD-10-CM | POA: Insufficient documentation

## 2011-07-14 DIAGNOSIS — K319 Disease of stomach and duodenum, unspecified: Secondary | ICD-10-CM

## 2011-07-14 NOTE — Progress Notes (Signed)
Stacie Clayton is a 50 y.o. female.    Chief Complaint  Patient presents with  . Other    HPI HPI Pt is doing well since going home.  No fevers/chills.  Has another week of cipro.  Is eating better and diarrhea is resolved.  Her energy level is improving.  She has been doing dressing changes 2 times/day.  Her drain output is slowing down.    Past Medical History  Diagnosis Date  . Weight loss   . Hyperlipidemia     normal now but has had in the past...per pt    Past Surgical History  Procedure Date  . Tubal reversal     tubal reversal  . Melanoma excision     back  . Cholecystectomy   . Tubal ligation 1986  . Ercp w/ sphicterotomy 06/14/11  . Repair of duodenal perforation Perforation from ERCP    Family History  Problem Relation Age of Onset  . Cancer Mother     lung  . Heart disease Father     heart attack    Social History History  Substance Use Topics  . Smoking status: Former Smoker    Quit date: 07/13/1986  . Smokeless tobacco: Not on file  . Alcohol Use: No    No Known Allergies  Current Outpatient Prescriptions  Medication Sig Dispense Refill  . ciprofloxacin (CIPRO) 500 MG/5ML (10%) suspension Take by mouth 2 (two) times daily.        Marland Kitchen HYDROcodone-acetaminophen (LORTAB) 10-500 MG per tablet Take 1 tablet by mouth as needed.        . pantoprazole (PROTONIX) 40 MG tablet Take 40 mg by mouth 2 (two) times daily.        Marland Kitchen BABY ASPIRIN PO Take by mouth.        . cetirizine (ZYRTEC) 10 MG tablet Take 10 mg by mouth daily.        . Nutritional Supplements (ESTROVEN ENERGY) CAPS Take by mouth.          Review of Systems Review of Systems  All other systems reviewed and are negative.    Physical Exam Physical Exam  Constitutional: She appears well-developed and well-nourished. No distress.       Well groomed, wearing makeup  HENT:  Head: Normocephalic and atraumatic.  Eyes: Conjunctivae are normal. Pupils are equal, round, and reactive to  light. No scleral icterus.  Neck: Normal range of motion. Neck supple.  Cardiovascular: Normal rate.   Respiratory: Effort normal. No respiratory distress. She exhibits no tenderness.  GI: Soft. She exhibits no distension. There is no tenderness. There is no rebound and no guarding.       Surgical drain medially located is still murky Two IR drains serous, but higher output.  Skin: Skin is warm and dry. No rash noted. She is not diaphoretic. No erythema. No pallor.  Psychiatric: She has a normal mood and affect. Her behavior is normal. Judgment and thought content normal.     Temperature 97.9 F (36.6 C).  Assessment/Plan  Duodenal perforation with R retroperitoneal abscesses. CT next week Leave drains for now.   Surgical drain is still murky Other 2 drains still with 20 mL or more/day.      Halcyon Heck 07/14/2011, 5:27 PM

## 2011-07-14 NOTE — Assessment & Plan Note (Signed)
CT next week Leave drains for now.   Surgical drain is still murky Other 2 drains still with 20 mL or more/day.

## 2011-07-14 NOTE — Patient Instructions (Signed)
Change to once daily wet to dry dressing changes.

## 2011-07-19 ENCOUNTER — Encounter (INDEPENDENT_AMBULATORY_CARE_PROVIDER_SITE_OTHER): Payer: Self-pay

## 2011-07-20 ENCOUNTER — Ambulatory Visit
Admission: RE | Admit: 2011-07-20 | Discharge: 2011-07-20 | Disposition: A | Discharge status: Home / Self Care | Payer: BC Managed Care – PPO | Source: Ambulatory Visit | Attending: General Surgery | Admitting: General Surgery

## 2011-07-20 DIAGNOSIS — K631 Perforation of intestine (nontraumatic): Secondary | ICD-10-CM

## 2011-07-20 MED ORDER — IOHEXOL 300 MG/ML  SOLN
100.0000 mL | Freq: Once | INTRAMUSCULAR | Status: AC | PRN
  Administered 2011-07-20: 100 mL via INTRAVENOUS

## 2011-07-22 ENCOUNTER — Other Ambulatory Visit (INDEPENDENT_AMBULATORY_CARE_PROVIDER_SITE_OTHER): Payer: Self-pay

## 2011-07-22 ENCOUNTER — Ambulatory Visit (INDEPENDENT_AMBULATORY_CARE_PROVIDER_SITE_OTHER): Payer: BC Managed Care – PPO | Admitting: General Surgery

## 2011-07-22 DIAGNOSIS — IMO0002 Reserved for concepts with insufficient information to code with codable children: Secondary | ICD-10-CM

## 2011-07-22 DIAGNOSIS — K219 Gastro-esophageal reflux disease without esophagitis: Secondary | ICD-10-CM

## 2011-07-22 DIAGNOSIS — K631 Perforation of intestine (nontraumatic): Secondary | ICD-10-CM

## 2011-07-22 DIAGNOSIS — K319 Disease of stomach and duodenum, unspecified: Secondary | ICD-10-CM

## 2011-07-22 MED ORDER — HYDROCODONE-ACETAMINOPHEN 10-500 MG PO TABS: 1.0000 | ORAL_TABLET | ORAL | Status: DC | PRN

## 2011-07-22 MED ORDER — PANTOPRAZOLE SODIUM 40 MG PO TBEC: 40.0000 mg | DELAYED_RELEASE_TABLET | Freq: Two times a day (BID) | ORAL | Status: DC

## 2011-07-22 MED ORDER — HYDROCODONE-ACETAMINOPHEN 5-325 MG PO TABS: 1.0000 | ORAL_TABLET | Freq: Four times a day (QID) | ORAL | Status: AC | PRN

## 2011-07-22 NOTE — Progress Notes (Signed)
Stacie Clayton is a 50 y.o. female.    Chief Complaint  Patient presents with  . Other    long term f/u check abd drains    HPI HPI Pt had episode of burning epigastric pain.  She called in and talked to Dr. Magnus Ivan who advised her to take pepcid. This resolved the pain.  She has 2 days of cipro left.  She is not having fevers/chills.  Her energy is improving.  Her appetite is still poor, but she is eating at least 3 cans of ensure a day in supplement.  Her pain medicine input is much improved.    Past Medical History  Diagnosis Date  . Weight loss   . Hyperlipidemia     normal now but has had in the past...per pt    Past Surgical History  Procedure Date  . Tubal reversal     tubal reversal  . Melanoma excision     back  . Cholecystectomy   . Tubal ligation 1986  . Ercp w/ sphicterotomy 06/14/11  . Repair of duodenal perforation Perforation from ERCP    Family History  Problem Relation Age of Onset  . Cancer Mother     lung  . Heart disease Father     heart attack    Social History History  Substance Use Topics  . Smoking status: Former Smoker    Quit date: 07/13/1986  . Smokeless tobacco: Not on file  . Alcohol Use: No    No Known Allergies  Current Outpatient Prescriptions  Medication Sig Dispense Refill  . BABY ASPIRIN PO Take by mouth.        . cetirizine (ZYRTEC) 10 MG tablet Take 10 mg by mouth daily.        . ciprofloxacin (CIPRO) 500 MG/5ML (10%) suspension Take by mouth 2 (two) times daily.        Marland Kitchen HYDROcodone-acetaminophen (LORTAB) 10-500 MG per tablet Take 1 tablet by mouth as needed.        . Nutritional Supplements (ESTROVEN ENERGY) CAPS Take by mouth.        . pantoprazole (PROTONIX) 40 MG tablet Take 40 mg by mouth 2 (two) times daily.          Review of Systems ROS  Physical Exam Physical Exam  Constitutional: She is oriented to person, place, and time. She appears well-developed and well-nourished.  HENT:  Head: Normocephalic and  atraumatic.  Respiratory: Effort normal. No respiratory distress.  GI: Soft. She exhibits no distension. There is no tenderness.       Surgical drain still fairly murky and foul smelling.  IR drains serous.    Neurological: She is alert and oriented to person, place, and time.  Skin: Skin is warm and dry. No rash noted. No erythema.  Psychiatric: She has a normal mood and affect. Her behavior is normal. Judgment and thought content normal.     There were no vitals taken for this visit.  Assessment/Plan Duodenal perforation with R retroperitoneal abscesses. Ct OK, IR drains pulled. Leave surgical drain. Follow up in 1-2 weeks.      Yishai Rehfeld 07/22/2011, 9:33 AM

## 2011-07-22 NOTE — Discharge Summary (Signed)
Stacie Clayton, Stacie Clayton              ACCOUNT NO.:  [id number]  MEDICAL RECORD NO.:  [id number]  LOCATION:  3303                         FACILITY:  [facility]  PHYSICIAN:  Sandria Bales. Ezzard Standing, M.D.  DATE OF BIRTH:  [birth year]  DATE OF ADMISSION:  06/19/2011 DATE OF DISCHARGE:  06/20/2011                        DISCHARGE SUMMARY - REFERRING   ADMISSION DIAGNOSES: 1. Duodenal perforation, status post ERCP, sphincterotomy pull-through     June 14, 2011, Dr. Vida Clayton. 2. Status post laparoscopic cholecystectomy for symptomatic     cholelithiasis on December 17, 2011, Dr. Emelia Clayton. 3. Dyslipidemia.  DISCHARGE DIAGNOSES: 1. Perforated duodenum, status post ERCP. 2. Status post laparoscopic cholecystectomy for symptomatic     cholelithiasis. 3. History of dyslipidemia. 4. Abdominal abscess with percutaneous drainage June 30, 2011 and July 03, 2011.  PROCEDURES: 1. Exploratory laparotomy and repair/patch duodenal perforation on     June 20, 2011, Dr. Donell Clayton. 2. CT drainage of the right lateral retroperitoneal an abscess June 28, 2011 and a second drainage on July 03, 2011.  BRIEF HISTORY:  The patient is a 50 year old female who underwent laparoscopic cholecystectomy on December 16, 2010 for symptomatic cholelithiasis.  She had intermittent discomfort since that time.  She was seen by Dr. Vida Clayton and subsequently scheduled for ERCP to see if there is still a stone in her common bile duct.  If that was ineffective, they planned to repeat her CT scan.  She underwent ERCP and was feeling better initially and Saturday night, pain became worse and she actually sat at home for the next several days, thinking she might have a pancreatitis and tried to ride it out.  She finally presented to the ER on June 19, 2011, which time her white count was 16,300. Hemoglobin 12, hematocrit 35, platelets 144,000.  Electrolytes showed a sodium of 132, potassium 2.9, chloride 93, CO2 of 25,  BUN of 14, creatinine is 0.9, glucose of 133.  Bilirubin 0.5, AST was 22, ALT was 21, alk phos was 65, albumin was 2.1.  CT scan was then obtained which was significant for gas fluid around the kidneys and retroperitoneum with some free air included in the peritoneal cavity.  At that point, it appeared that the patient had a perforation of her duodenum.  Dr. Donell Clayton was on-call and the patient was seen in the ER subsequently scheduled to go the OR that evening.  For further history and physical, please see the dictated note.  ALLERGIES:  None.  HOME MEDS:  Calcium plus D and esterase.  HOSPITAL COURSE:  The patient was admitted from the ER and taken directly to the operating room by Dr. Donell Clayton.  She underwent procedures described above.  She was seen in consultation by GI service the following AM.  She remained hemodynamically stable.  The first postoperative morning and she appeared to be healing well.  On June 22, 2011 her hemoglobin dropped her initial hemoglobins 12.5 on admission June 20, 2011.  On June 21, 2011, it was 9.0 and 26  On 7/04 her hemglobin dropped to 6.3 with hematocrit of 18.6.  The patient was seen at that time and transfused 2 units  packed cells.  Her drains showed a good deal of new bilious fluid coming from the JP bulb and it was our impression at that time that she probably had an ongoing leak from the duodenal patch.     On June 23, 2011, the patient was comfortable and actually feeling better. Hemoglobin was still down and she was transfused again with packed cells and FFP by Dr. Magnus Clayton.  She was maintained on IV antibiotics at that point. She appeared to be showing some improvement in her duodenal leak, and remained stable.   She was hypokalemic.  Potassium was replaced.  She continued to have an elevated white count because we were concerned with postoperative ileus and prolonged problems.  She was started on TNA. She continued to make steady progress,  but also had ongoing abdominal pain.  A repeat CT was obtained on June 27, 2011 which showed a 1.1 x 2.6 focal collection of extraluminal content and she had another collection which was 5.8 x 8.5 cm.  The 1.1 x 2.6 was felt to be of focal micro perforation of duodenum, second collection was more compatible with an abscess.  Additionally, she has had bilateral atelectasis, small pleural effusion.  She went back to x-ray the next day and underwent percutaneous drainage of this site.  The patient had a post-op drains, which remained patent.  She continued to make slow steady progress.   Her white count actually came down on the first day.  Her TNA was managed by the pharmacy.  She continued to have difficulties even though the drain was placed and she was sent back to CT on July 03, 2011 shows a decrease in size of complex abscess collection in the right pericolic gutter and right perinephric space.  But there was a question of another loculated collection.  The patient went back to Interventional Radiology on July 03, 2011 and had another percutaneous drainage placed in approximately the same site.  The patient tolerated this well and 65 mL of additional yellow thick purulent fluid was aspirated and a permanent catheter was left in place.  The patient started making some progress. She was on 2 antibiotic coverage up until a culture came back showing Enterobacter aerogenes from the site on June 28, 2011. At which time she was switched over to Cipro for which the Enterobacter aerogenes was most sensitive.  The patient has made slow, but steady progress.  Her diet has been advanced.  The original primary drain was removed.    The patient has been ambulating.  She has been weaned off TNA and is now taking full diet and had a bowel movement today.  A repeat CT scan was obtained again on July 08, 2011, this showed near complete resolution of the right paracolic gutter abscess.  There was a right  posterior perinephric space with mild decrease in size from prior exams.  Focal fluid collections within the infraumbilical abdominal wall that the level incision was identified and drained.  Again pathology eventually showed Enterobacter aerogenes.  The patient was transitioned from IV antibiotics to oral antibiotics in the afternoon of July 08, 2011. Repeat CT was reviewed by Dr. Ezzard Standing, it was his opinion.  The patient could be seen in the a.m. by Dr. Donell Clayton who was on-call and then ultimately discharged home on July 09, 2011 or July 10, 2011 when Dr. Donell Clayton felt she was adequately improved.  We planned to send her home with at least the 2 drains, placed by Interventional Radiology.  The other remaining drain will probably be pulled by Dr. Donell Clayton prior to discharge, but she will decide that that on Saturday and Sunday prior to discharge.  At this point, she is doing quite well.  She has had a wound VAC over wound which has been healing nicely.    We planned to transition that from a wound VAC to wet to dry dressings.   She has been instructed to how to irrigate her drain and record the drainage daily. We will send her home on Cipro 500 mg p.o. b.i.d. for 14 days.    She will continue Ensure 1 can b.i.d. with meals, Protonix 40 mg b.i.d. for 1 month.  No refills at this time.   She will continue her dry eye gel OTC each eye b.i.d. at home, hydrocodone/APAP 10/500 one q.6 h p.r.n. for pain.  She can have her natural estrogen OTC one b.i.d., Zofran 4 mg p.o. q. 6 p.r.n. and Zyrtec 1 tablet daily.  Follow-up will be with Dr. Donell Clayton on July 14, 2011 at 4:30 p.m.  DISCHARGE ACTIVITY:  Light or moderate as tolerated.  No strenuous activity for 2 to 3 weeks.  We can discuss going back to work after all drains are removed and Dr. Donell Clayton is sufficiently concerned she is over this medical problem.  CONDITION ON DISCHARGE:  Improved.   Eber Hong, P.A.  Sandria Bales. Ezzard Standing, M.D.,  FACS   WDJ/MEDQ  D:  07/08/2011  T:  07/08/2011  Job:  [id number]  cc:   Duke Salvia. Marcelle Overlie, M.D.  Electronically Signed by Sherrie George P.A. on 07/19/2011 02:22:56 PM Electronically Signed by Ovidio Kin M.D. on 07/22/2011 07:42:17 AM

## 2011-07-22 NOTE — Assessment & Plan Note (Signed)
Ct OK, IR drains pulled. Leave surgical drain. Follow up in 1-2 weeks.

## 2011-07-25 ENCOUNTER — Ambulatory Visit (INDEPENDENT_AMBULATORY_CARE_PROVIDER_SITE_OTHER): Payer: BC Managed Care – PPO | Admitting: General Surgery

## 2011-07-26 NOTE — Op Note (Signed)
  NAMECHRISLYNN, Stacie Clayton              ACCOUNT NO.:  [id number]  MEDICAL RECORD NO.:  [id number]  LOCATION:  WLEN                         FACILITY:  [facility]  PHYSICIAN:  Petra Kuba, M.D.    DATE OF BIRTH:  1961-12-02  DATE OF PROCEDURE: DATE OF DISCHARGE:                              OPERATIVE REPORT   PROCEDURE:  ERCP sphincterotomy and balloon pull-through.  INDICATIONS:  Patient with probable CBD stones with recurrent pain and elevated liver test, status post lap chole.  Consent was signed after risks, benefits, methods, options thoroughly discussed multiple times in the past with the patient and with her husband prior to the procedure. Medicines used per general anesthesia.  PROCEDURE:  The side-viewing therapeutic video duodenoscope was inserted by indirect vision into the stomach and advanced through a normal antrum and pylorus, into the duodenum where a normal-appearing ampulla was brought into view.  Using the triple-lumen sphincterotome loaded with Jagwire, deep selective cannulation was obtained.  There were no PD injections or no PD wire advancements.  Once deep selective cannulation was obtained, the CBD was injected which was normal without obvious stones or other abnormality.  We went ahead and proceeded with a medium- to-large sphincterotomy in the customary fashion until we had excellent biliary drainage and a fully bowed sphincterotome could get in and out of the duct.  We then exchanged the sphincterotome for the adjustable 12 to 15 mm balloon and proceeded with 3 balloon pull-through without obvious stone, debris or sludge.  The balloon pulled readily through the patent sphincterotomy site.  We then proceeded with an occlusion cholangiogram which had no obvious residual abnormalities and excellent biliary drainage and again the balloon pulled readily through the patent sphincterotomy site.  We went ahead and proceeded with one more balloon pull-through with again  no obvious residual abnormality.  The wire and balloon were removed.  The scope was removed, and again, there was excellent biliary drainage.  The patient tolerated the procedure well. There was no obvious immediate complication.  ENDOSCOPIC DIAGNOSES: 1. Normal ampulla. 2. No PD injections or wire advancements. 3. Normal common bile duct without obvious stone, status post medium     to large sphincterotomy and multiple 12-mm balloon pull-through     without obvious stone being delivered. 4. Negative occlusion cholangiogram with excellent biliary drainage.  PLAN:  We will observe for delayed complications.  If none, follow up p.r.n. or in 3 months to recheck symptoms and make sure no further workup plans are needed and otherwise, would probably discuss a screening colonoscopy over the next 3 to 6 months if doing well and she will call me sooner p.r.n.          ______________________________ Petra Kuba, M.D.     MEM/MEDQ  D:  06/14/2011  T:  06/14/2011  Job:  [id number]  cc:   Juanetta Gosling, MD [address] [place] [address]  Electronically Signed by Vida Rigger M.D. on 07/26/2011 04:33:24 PM

## 2011-07-30 NOTE — Discharge Summary (Signed)
  Stacie Clayton, STOHR              ACCOUNT NO.:  [id number]  MEDICAL RECORD NO.:  [id number]  LOCATION:                                 FACILITY:  PHYSICIAN:  Lodema Pilot, MD       DATE OF BIRTH:  [birth year]  DATE OF ADMISSION:  06/19/2011 DATE OF DISCHARGE:  07/10/2011                              DISCHARGE SUMMARY   ADDENDUM:  SUMMARY OF HOSPITAL COURSE:  The patient since July 08, 2011, has been converted from IV to p.o. antibiotics and has tolerated Cipro p.o. well. She has also been converted to p.o. pain medication, has been using Vicodin on an as-needed basis with good toleration.  Her drain output has been stable and there have been no additional changes to her drains. Her wound care by means of normal saline wet-to-dry is being tolerated well.  The patient appears now stable for discharge home.  She will continue her medications as listed on the discharge medication reconciliation.  She will have normal saline wet-to-dry dressing twice daily to her abdomen.  She will flush her drains once daily with 10 mL of sterile saline.  She has a follow-up appointment with Dr. Almond Lint this upcoming week on Thursday July 14, 2011, at 4:30.  She can contact our office any time for any questions, concerns prior to this.     Brayton El, PA-C   ______________________________ Lodema Pilot, MD    KB/MEDQ  D:  07/10/2011  T:  07/10/2011  Job:  [id number]  Electronically Signed by Brayton El  on 07/25/2011 02:58:29 PM Electronically Signed by Lodema Pilot DO on 07/30/2011 05:56:47 PM

## 2011-08-03 ENCOUNTER — Other Ambulatory Visit (INDEPENDENT_AMBULATORY_CARE_PROVIDER_SITE_OTHER): Payer: Self-pay | Admitting: General Surgery

## 2011-08-03 ENCOUNTER — Encounter (INDEPENDENT_AMBULATORY_CARE_PROVIDER_SITE_OTHER): Payer: Self-pay | Admitting: General Surgery

## 2011-08-03 ENCOUNTER — Ambulatory Visit (INDEPENDENT_AMBULATORY_CARE_PROVIDER_SITE_OTHER): Payer: BC Managed Care – PPO | Admitting: General Surgery

## 2011-08-03 ENCOUNTER — Other Ambulatory Visit (INDEPENDENT_AMBULATORY_CARE_PROVIDER_SITE_OTHER): Payer: Self-pay

## 2011-08-03 DIAGNOSIS — K631 Perforation of intestine (nontraumatic): Secondary | ICD-10-CM

## 2011-08-03 DIAGNOSIS — K319 Disease of stomach and duodenum, unspecified: Secondary | ICD-10-CM

## 2011-08-03 DIAGNOSIS — G8918 Other acute postprocedural pain: Secondary | ICD-10-CM

## 2011-08-03 MED ORDER — HYDROCODONE-ACETAMINOPHEN 5-325 MG PO TABS: 1.0000 | ORAL_TABLET | Freq: Four times a day (QID) | ORAL | Status: AC | PRN

## 2011-08-03 NOTE — Assessment & Plan Note (Signed)
Remaining drain pulled. Pt increasing her po intake. Wound is healing well. May shower. May drive on short trips in town.

## 2011-08-03 NOTE — Progress Notes (Signed)
HPI: Pt energy level continuing to slowly improve.  Taking only a few hydrocodone/day.  She is not having nausea or vomiting.  She is still having difficulty eating solid food because it feels like it sits on her stomach.  She is taking a ppi.  She denies fevers/chills.  Her surgical drain has put out <90mL/day for over a week.    Exam: Pt is alert and oriented times 3. Abd soft, non tender, non distended.  Scant amount of drainage in bulb.   Drain pulled. Wound granulating well.  Granulation tissue cauterized with silver nitrate.    Duodenal perforation with R retroperitoneal abscesses. Remaining drain pulled. Pt increasing her po intake. Wound is healing well. May shower. May drive on short trips in town.

## 2011-08-19 ENCOUNTER — Encounter (INDEPENDENT_AMBULATORY_CARE_PROVIDER_SITE_OTHER): Payer: Self-pay

## 2011-08-24 ENCOUNTER — Encounter (INDEPENDENT_AMBULATORY_CARE_PROVIDER_SITE_OTHER): Payer: Self-pay | Admitting: General Surgery

## 2011-08-24 ENCOUNTER — Ambulatory Visit (INDEPENDENT_AMBULATORY_CARE_PROVIDER_SITE_OTHER): Payer: BC Managed Care – PPO | Admitting: General Surgery

## 2011-08-24 VITALS — BP 120/76 | HR 68 | Temp 96.8°F

## 2011-08-24 DIAGNOSIS — K631 Perforation of intestine (nontraumatic): Secondary | ICD-10-CM

## 2011-08-24 DIAGNOSIS — K319 Disease of stomach and duodenum, unspecified: Secondary | ICD-10-CM

## 2011-08-24 MED ORDER — HYDROCODONE-ACETAMINOPHEN 10-500 MG PO TABS: 1.0000 | ORAL_TABLET | ORAL | Status: DC | PRN

## 2011-08-24 NOTE — Assessment & Plan Note (Signed)
Given continued difficulty with anything other than liquids, would do upper GI on her to rule out gastric outlet obstruction.   Her decreased energy is still anticipated given the severity of her illness.   I will see her back in 3-4 weeks.

## 2011-08-24 NOTE — Progress Notes (Signed)
HISTORY: Pt continues to slowly improve.  Wound has completely healed.  She continues to have diminished energy.  She is also still unable to eat anything other than liquids and crackers.  She feels that the food just sits in her stomach, and she eventually gets nauseated.  She is not having fevers/ chills.     EXAM: Head: Normocephalic and atraumatic. Well groomed. Eyes:  Conjunctivae are normal. Pupils are equal, round, and reactive to light. No scleral icterus.  Resp: No respiratory distress, normal effort. Abd: Soft. Bowel sounds are normal. Abdomen is soft, non distended and non tender No masses are palpable.  There is no rebound and no guarding.   Wound is completely healed.   Neurological: Alert and oriented to person, place, and time. Coordination normal.  Skin: Skin is warm and dry. No rash noted. No diaphoretic. No erythema. No pallor.  Psychiatric: Normal mood and affect. Normal behavior. Judgment and thought content normal.    ASSESSMENT AND PLAN:   Duodenal perforation with R retroperitoneal abscesses. Given continued difficulty with anything other than liquids, would do upper GI on her to rule out gastric outlet obstruction.   Her decreased energy is still anticipated given the severity of her illness.   I will see her back in 3-4 weeks.        Maudry Diego, MD Surgical Oncology, General & Endocrine Surgery [facility]  Meriel Pica, MD Meriel Pica, MD

## 2011-08-26 ENCOUNTER — Ambulatory Visit
Admission: RE | Admit: 2011-08-26 | Discharge: 2011-08-26 | Disposition: A | Discharge status: Home / Self Care | Payer: BC Managed Care – PPO | Source: Ambulatory Visit | Attending: General Surgery | Admitting: General Surgery

## 2011-08-26 NOTE — Progress Notes (Signed)
Quick Note:  Please let pt know study looks good. No areas of concern. ______ 

## 2011-08-29 ENCOUNTER — Other Ambulatory Visit: Payer: BC Managed Care – PPO

## 2011-09-10 ENCOUNTER — Other Ambulatory Visit (INDEPENDENT_AMBULATORY_CARE_PROVIDER_SITE_OTHER): Payer: Self-pay | Admitting: General Surgery

## 2011-09-13 NOTE — Telephone Encounter (Signed)
Called refill on Protonix 40mg  to [facility].  Pt aware.

## 2011-09-13 NOTE — Telephone Encounter (Signed)
This was called in by Milas Hock

## 2011-09-21 ENCOUNTER — Other Ambulatory Visit (INDEPENDENT_AMBULATORY_CARE_PROVIDER_SITE_OTHER): Payer: Self-pay

## 2011-09-21 DIAGNOSIS — G8918 Other acute postprocedural pain: Secondary | ICD-10-CM

## 2011-09-21 MED ORDER — HYDROCODONE-ACETAMINOPHEN 5-325 MG PO TABS: 1.0000 | ORAL_TABLET | Freq: Four times a day (QID) | ORAL | Status: AC | PRN

## 2011-09-27 ENCOUNTER — Encounter (INDEPENDENT_AMBULATORY_CARE_PROVIDER_SITE_OTHER): Payer: Self-pay | Admitting: General Surgery

## 2011-09-27 ENCOUNTER — Ambulatory Visit (INDEPENDENT_AMBULATORY_CARE_PROVIDER_SITE_OTHER): Payer: BC Managed Care – PPO | Admitting: General Surgery

## 2011-09-27 VITALS — BP 118/88 | HR 60 | Temp 97.2°F | Resp 20 | Ht 61.0 in | Wt 137.1 lb

## 2011-09-27 DIAGNOSIS — K631 Perforation of intestine (nontraumatic): Secondary | ICD-10-CM

## 2011-09-27 DIAGNOSIS — K319 Disease of stomach and duodenum, unspecified: Secondary | ICD-10-CM

## 2011-09-27 DIAGNOSIS — K432 Incisional hernia without obstruction or gangrene: Secondary | ICD-10-CM

## 2011-09-27 HISTORY — DX: Incisional hernia without obstruction or gangrene: K43.2

## 2011-09-27 NOTE — Assessment & Plan Note (Signed)
Still has some RUQ pain and lateral R chest pain with deep inspiration.   Continues to have minimal appetite and decreased energy. Ordering CT chest/abdomen/pelvis to eval for PE or fluid collection.

## 2011-09-27 NOTE — Progress Notes (Signed)
HISTORY: Pt continuing to improve, though pt has developed hernia in upper abdomen.  She continues to have burning pain in the RUQ.  She is continuing on bland, soft foods of her own choice.  She denies fevers/chills.  She has developed R lateral chest pain with deep inspiration.     PERTINENT REVIEW OF SYSTEMS: O/w negative.     EXAM: Head: Normocephalic and atraumatic.  Eyes:  Conjunctivae are normal. Pupils are equal, round, and reactive to light. No scleral icterus.  Neck:  Normal range of motion. Neck supple. No tracheal deviation present. No thyromegaly present.  Resp: No respiratory distress, normal effort. Abd: Soft. Upper midline hernia present.  Abdominal wall is tender along both sides of the incision.   Neurological: Alert and oriented to person, place, and time. Coordination normal.  Skin: Skin is warm and dry. No rash noted. No diaphoretic. No erythema. No pallor.  Psychiatric: Normal mood and affect. Normal behavior. Judgment and thought content normal.     Duodenal perforation with R retroperitoneal abscesses. Still has some RUQ pain and lateral R chest pain with deep inspiration.   Continues to have minimal appetite and decreased energy. Ordering CT chest/abdomen/pelvis to eval for PE or fluid collection.    Incisional hernia Will need surgical repair. Pt not medically ready after critical illness of the summer. Will see how she is doing at next visit.          Maudry Diego, MD Surgical Oncology, General & Endocrine Surgery [facility]  Meriel Pica, MD Meriel Pica, MD

## 2011-09-27 NOTE — Patient Instructions (Signed)
Try regular diet.

## 2011-09-27 NOTE — Assessment & Plan Note (Signed)
Will need surgical repair. Pt not medically ready after critical illness of the summer. Will see how she is doing at next visit.

## 2011-09-29 ENCOUNTER — Other Ambulatory Visit: Payer: BC Managed Care – PPO

## 2011-09-30 ENCOUNTER — Other Ambulatory Visit: Payer: BC Managed Care – PPO

## 2011-10-03 ENCOUNTER — Ambulatory Visit
Admission: RE | Admit: 2011-10-03 | Discharge: 2011-10-03 | Disposition: A | Discharge status: Home / Self Care | Payer: BC Managed Care – PPO | Source: Ambulatory Visit | Attending: General Surgery | Admitting: General Surgery

## 2011-10-03 DIAGNOSIS — K631 Perforation of intestine (nontraumatic): Secondary | ICD-10-CM

## 2011-10-03 MED ORDER — IOHEXOL 300 MG/ML  SOLN
100.0000 mL | Freq: Once | INTRAMUSCULAR | Status: AC | PRN
  Administered 2011-10-03: 100 mL via INTRAVENOUS

## 2011-10-03 NOTE — Progress Notes (Signed)
Quick Note:  Please let pt know study looks good. Fluid collections resolved, no pulmonary embolus. ______

## 2011-11-04 ENCOUNTER — Ambulatory Visit (INDEPENDENT_AMBULATORY_CARE_PROVIDER_SITE_OTHER): Payer: BC Managed Care – PPO | Admitting: General Surgery

## 2011-11-04 ENCOUNTER — Encounter (INDEPENDENT_AMBULATORY_CARE_PROVIDER_SITE_OTHER): Payer: Self-pay | Admitting: General Surgery

## 2011-11-04 VITALS — BP 128/86 | HR 56 | Temp 98.2°F | Resp 16 | Ht 61.0 in | Wt 132.4 lb

## 2011-11-04 DIAGNOSIS — K432 Incisional hernia without obstruction or gangrene: Secondary | ICD-10-CM

## 2011-11-04 MED ORDER — HYOSCYAMINE SULFATE 0.125 MG SL SUBL: 0.1250 mg | SUBLINGUAL_TABLET | SUBLINGUAL | Status: AC | PRN

## 2011-11-04 NOTE — Assessment & Plan Note (Signed)
Will try abdominal binder and Levsin. Pt will discuss with husband timing of repair.

## 2011-11-04 NOTE — Progress Notes (Signed)
HISTORY: Pt continues to have discomfort in the RUQ.  Overall the pain is improving, but continues at night.  She is not having any fevers/ chills.  We repeated her CT scan and this was unrevealing for any source of pain.  I think some of this is from the hernia, but some is from whatever was plaguing her before the perforation.  We have done multiple studies that have not demonstrated any pathology.     PERTINENT REVIEW OF SYSTEMS: Otherwise negative.    EXAM: Head: Normocephalic and atraumatic.  Eyes:  Conjunctivae are normal. Pupils are equal, round, and reactive to light. No scleral icterus.  Neck:  Normal range of motion. Neck supple. No tracheal deviation present. No thyromegaly present.  Resp: No respiratory distress, normal effort. Abd:  Abdomen is soft, non distended and non tender. There is a hernia present in the RUQ.  This is slightly larger than before.  The edges of the hernia are tender.   Neurological: Alert and oriented to person, place, and time. Coordination normal.  Skin: Skin is warm and dry. No rash noted. No diaphoretic. No erythema. No pallor.  Psychiatric: Normal mood and affect. Normal behavior. Judgment and thought content normal.     ASSESSMENT AND PLAN:   Incisional hernia Will try abdominal binder and Levsin. Pt will discuss with husband timing of repair.   I will see patient back in 2 months if she elects to wait until after the holidays to have surgery.    I am not sure of the ultimate etiology of her abdominal pain and belching.  I think much of it will improve with hernia repair, but some will not.     Maudry Diego, MD Surgical Oncology, General & Endocrine Surgery [facility]  Meriel Pica, MD Meriel Pica, MD

## 2011-11-04 NOTE — Patient Instructions (Signed)
Wear abdominal binder while up and about, especially with housework Call when decide about hernia repair.  Try levsin for cramping pain.  OK to stop protonix.

## 2011-11-16 ENCOUNTER — Other Ambulatory Visit (INDEPENDENT_AMBULATORY_CARE_PROVIDER_SITE_OTHER): Payer: Self-pay | Admitting: General Surgery

## 2011-11-20 ENCOUNTER — Other Ambulatory Visit (INDEPENDENT_AMBULATORY_CARE_PROVIDER_SITE_OTHER): Payer: Self-pay | Admitting: General Surgery

## 2011-11-25 ENCOUNTER — Encounter (HOSPITAL_COMMUNITY): Payer: Self-pay | Admitting: Pharmacy Technician

## 2011-11-30 ENCOUNTER — Encounter (HOSPITAL_COMMUNITY)
Admission: RE | Admit: 2011-11-30 | Discharge: 2011-11-30 | Disposition: A | Discharge status: Home / Self Care | Payer: BC Managed Care – PPO | Source: Ambulatory Visit | Attending: General Surgery | Admitting: General Surgery

## 2011-11-30 ENCOUNTER — Encounter (HOSPITAL_COMMUNITY): Payer: Self-pay

## 2011-11-30 HISTORY — DX: Other complications of anesthesia, initial encounter: T88.59XA

## 2011-11-30 HISTORY — DX: Adverse effect of unspecified anesthetic, initial encounter: T41.45XA

## 2011-11-30 HISTORY — DX: Reserved for inherently not codable concepts without codable children: IMO0001

## 2011-11-30 HISTORY — DX: Encounter for other specified aftercare: Z51.89

## 2011-11-30 HISTORY — DX: Other specified postprocedural states: Z98.890

## 2011-11-30 HISTORY — DX: Nausea with vomiting, unspecified: R11.2

## 2011-11-30 LAB — CBC
HCT: 38.6 % (ref 36.0–46.0)
MCHC: 33.7 g/dL (ref 30.0–36.0)
Platelets: 219 10*3/uL (ref 150–400)
RDW: 13.8 % (ref 11.5–15.5)

## 2011-11-30 LAB — SURGICAL PCR SCREEN
MRSA, PCR: NEGATIVE
Staphylococcus aureus: NEGATIVE

## 2011-11-30 NOTE — Pre-Procedure Instructions (Signed)
20 Stacie Clayton  11/30/2011   Your procedure is scheduled on:  Thursday 12/08/11  Report to [facility] at 1000 AM.  Call this number if you have problems the morning of surgery: [phone]   Remember:   Do not eat food:After Midnight.  May have clear liquids: up to 4 Hours before arrival.  Clear liquids include soda, tea, black coffee, apple or grape juice, broth.  Take these medicines the morning of surgery with A SIP OF WATER:  TYLENOL   Do not wear jewelry, make-up or nail polish.  Do not wear lotions, powders, or perfumes. You may wear deodorant.  Do not shave 48 hours prior to surgery.  Do not bring valuables to the hospital.  Contacts, dentures or bridgework may not be worn into surgery.  Leave suitcase in the car. After surgery it may be brought to your room.  For patients admitted to the hospital, checkout time is 11:00 AM the day of discharge.   Patients discharged the day of surgery will not be allowed to drive home.  Name and phone number of your driver:   Special Instructions: CHG Shower Use Special Wash: 1/2 bottle night before surgery and 1/2 bottle morning of surgery.   Please read over the following fact sheets that you were given: Pain Booklet, MRSA Information and Surgical Site Infection Prevention   STOP ASPIRIN, COUMADIN, PLAVIX, EFFIENT, HERBAL MEDICINES.

## 2011-12-07 MED ORDER — CEFAZOLIN SODIUM 1-5 GM-% IV SOLN
1.0000 g | INTRAVENOUS | Status: AC
  Administered 2011-12-08: 1 g via INTRAVENOUS
  Filled 2011-12-07: qty 50

## 2011-12-08 ENCOUNTER — Encounter (HOSPITAL_COMMUNITY): Payer: Self-pay | Admitting: *Deleted

## 2011-12-08 ENCOUNTER — Inpatient Hospital Stay (HOSPITAL_COMMUNITY)
Admission: RE | Admit: 2011-12-08 | Discharge: 2011-12-13 | DRG: 159 | Disposition: A | Discharge status: Home / Self Care | Payer: BC Managed Care – PPO | Source: Ambulatory Visit | Attending: General Surgery | Admitting: General Surgery

## 2011-12-08 ENCOUNTER — Inpatient Hospital Stay (HOSPITAL_COMMUNITY): Payer: BC Managed Care – PPO | Admitting: *Deleted

## 2011-12-08 ENCOUNTER — Ambulatory Visit: Admit: 2011-12-08 | Payer: Self-pay | Admitting: General Surgery

## 2011-12-08 ENCOUNTER — Encounter (HOSPITAL_COMMUNITY)
Admission: RE | Disposition: A | Discharge status: Home / Self Care | Payer: Self-pay | Source: Ambulatory Visit | Attending: General Surgery

## 2011-12-08 ENCOUNTER — Encounter (HOSPITAL_COMMUNITY): Payer: Self-pay | Admitting: General Surgery

## 2011-12-08 DIAGNOSIS — E785 Hyperlipidemia, unspecified: Secondary | ICD-10-CM | POA: Diagnosis present

## 2011-12-08 DIAGNOSIS — K432 Incisional hernia without obstruction or gangrene: Secondary | ICD-10-CM

## 2011-12-08 DIAGNOSIS — D62 Acute posthemorrhagic anemia: Secondary | ICD-10-CM | POA: Diagnosis not present

## 2011-12-08 DIAGNOSIS — K439 Ventral hernia without obstruction or gangrene: Secondary | ICD-10-CM

## 2011-12-08 DIAGNOSIS — M6200 Separation of muscle (nontraumatic), unspecified site: Secondary | ICD-10-CM | POA: Diagnosis present

## 2011-12-08 DIAGNOSIS — Z85820 Personal history of malignant melanoma of skin: Secondary | ICD-10-CM

## 2011-12-08 DIAGNOSIS — K469 Unspecified abdominal hernia without obstruction or gangrene: Secondary | ICD-10-CM

## 2011-12-08 HISTORY — PX: VENTRAL HERNIA REPAIR: SHX424

## 2011-12-08 HISTORY — DX: Anemia, unspecified: D64.9

## 2011-12-08 LAB — CBC
HCT: 32.9 % — ABNORMAL LOW (ref 36.0–46.0)
MCHC: 32.8 g/dL (ref 30.0–36.0)
MCV: 88.9 fL (ref 78.0–100.0)
RDW: 13.6 % (ref 11.5–15.5)

## 2011-12-08 LAB — CREATININE, SERUM: GFR calc non Af Amer: 79 mL/min — ABNORMAL LOW (ref >90)

## 2011-12-08 SURGERY — REPAIR, HERNIA, VENTRAL
Anesthesia: General

## 2011-12-08 SURGERY — REPAIR, HERNIA, VENTRAL
Anesthesia: General | Site: Abdomen | Wound class: Clean

## 2011-12-08 MED ORDER — SODIUM CHLORIDE 0.9 % IV SOLN
10.0000 mg | INTRAVENOUS | Status: DC | PRN
  Administered 2011-12-08: 5 ug/min via INTRAVENOUS

## 2011-12-08 MED ORDER — MORPHINE SULFATE 2 MG/ML IJ SOLN: 2.0000 mg | INTRAMUSCULAR | Status: DC | PRN

## 2011-12-08 MED ORDER — PANTOPRAZOLE SODIUM 40 MG IV SOLR
40.0000 mg | INTRAVENOUS | Status: DC
  Administered 2011-12-08 – 2011-12-12 (×5): 40 mg via INTRAVENOUS
  Filled 2011-12-08 (×6): qty 40

## 2011-12-08 MED ORDER — FIBRIN SEALANT COMPONENT 5 ML EX KIT
PACK | CUTANEOUS | Status: DC | PRN
  Administered 2011-12-08: 1

## 2011-12-08 MED ORDER — HYDROMORPHONE HCL PF 1 MG/ML IJ SOLN
0.2500 mg | INTRAMUSCULAR | Status: DC | PRN
  Administered 2011-12-08: 0.5 mg via INTRAVENOUS

## 2011-12-08 MED ORDER — FENTANYL CITRATE 0.05 MG/ML IJ SOLN
INTRAMUSCULAR | Status: DC | PRN
  Administered 2011-12-08: 25 ug via INTRAVENOUS
  Administered 2011-12-08: 50 ug via INTRAVENOUS

## 2011-12-08 MED ORDER — NEOSTIGMINE METHYLSULFATE 1 MG/ML IJ SOLN
INTRAMUSCULAR | Status: DC | PRN
  Administered 2011-12-08: 3 mg via INTRAVENOUS

## 2011-12-08 MED ORDER — LIDOCAINE HCL (CARDIAC) 20 MG/ML IV SOLN
INTRAVENOUS | Status: DC | PRN
  Administered 2011-12-08: 40 mg via INTRAVENOUS
  Administered 2011-12-08: 100 mg via INTRAVENOUS

## 2011-12-08 MED ORDER — DIPHENHYDRAMINE HCL 50 MG/ML IJ SOLN: 12.5000 mg | Freq: Four times a day (QID) | INTRAMUSCULAR | Status: DC | PRN

## 2011-12-08 MED ORDER — ACETAMINOPHEN 10 MG/ML IV SOLN
1000.0000 mg | Freq: Four times a day (QID) | INTRAVENOUS | Status: AC
  Administered 2011-12-08 – 2011-12-09 (×4): 1000 mg via INTRAVENOUS
  Filled 2011-12-08 (×5): qty 100

## 2011-12-08 MED ORDER — ACETAMINOPHEN 10 MG/ML IV SOLN
INTRAVENOUS | Status: AC
  Filled 2011-12-08: qty 100

## 2011-12-08 MED ORDER — MIDAZOLAM HCL 5 MG/5ML IJ SOLN
INTRAMUSCULAR | Status: DC | PRN
  Administered 2011-12-08 (×2): 2 mg via INTRAVENOUS

## 2011-12-08 MED ORDER — PROMETHAZINE HCL 25 MG/ML IJ SOLN: 12.5000 mg | INTRAMUSCULAR | Status: DC | PRN

## 2011-12-08 MED ORDER — MORPHINE SULFATE (PF) 1 MG/ML IV SOLN
INTRAVENOUS | Status: DC
  Administered 2011-12-08: 17:00:00 via INTRAVENOUS
  Administered 2011-12-08: 7.5 mg via INTRAVENOUS
  Administered 2011-12-09: 2.1 mg via INTRAVENOUS
  Administered 2011-12-09: 1.5 mg via INTRAVENOUS
  Administered 2011-12-09: 4 mg via INTRAVENOUS
  Administered 2011-12-09 (×2): 1.5 mg via INTRAVENOUS
  Administered 2011-12-09: 6 mg via INTRAVENOUS
  Administered 2011-12-10: 3 mg via INTRAVENOUS
  Administered 2011-12-10 (×2): 1.5 mg via INTRAVENOUS
  Administered 2011-12-10: 19:00:00 via INTRAVENOUS
  Administered 2011-12-10: 6 mg via INTRAVENOUS
  Administered 2011-12-10: 4.5 mg via INTRAVENOUS
  Administered 2011-12-10: 6 mg via INTRAVENOUS
  Administered 2011-12-11: 3 mg via INTRAVENOUS
  Administered 2011-12-11: 4.5 mg via INTRAVENOUS
  Administered 2011-12-11 (×2): 3 mg via INTRAVENOUS
  Administered 2011-12-11: 2.65 mg via INTRAVENOUS
  Administered 2011-12-11: 6 mg via INTRAVENOUS
  Administered 2011-12-12 (×2): 1.5 mg via INTRAVENOUS
  Administered 2011-12-12 (×2): 4.5 mg via INTRAVENOUS
  Administered 2011-12-12 – 2011-12-13 (×2): 3 mg via INTRAVENOUS
  Filled 2011-12-08 (×4): qty 25

## 2011-12-08 MED ORDER — ONDANSETRON HCL 4 MG/2ML IJ SOLN
INTRAMUSCULAR | Status: DC | PRN
  Administered 2011-12-08 (×2): 4 mg via INTRAVENOUS

## 2011-12-08 MED ORDER — KCL IN DEXTROSE-NACL 20-5-0.45 MEQ/L-%-% IV SOLN
INTRAVENOUS | Status: DC
  Administered 2011-12-08 – 2011-12-13 (×8): via INTRAVENOUS
  Filled 2011-12-08 (×16): qty 1000

## 2011-12-08 MED ORDER — PROPOFOL 10 MG/ML IV EMUL
INTRAVENOUS | Status: DC | PRN
  Administered 2011-12-08: 150 mg via INTRAVENOUS

## 2011-12-08 MED ORDER — ROCURONIUM BROMIDE 100 MG/10ML IV SOLN
INTRAVENOUS | Status: DC | PRN
  Administered 2011-12-08 (×2): 10 mg via INTRAVENOUS
  Administered 2011-12-08: 5 mg via INTRAVENOUS
  Administered 2011-12-08: 50 mg via INTRAVENOUS
  Administered 2011-12-08 (×2): 5 mg via INTRAVENOUS

## 2011-12-08 MED ORDER — SODIUM CHLORIDE 0.9 % IR SOLN
Status: DC | PRN
  Administered 2011-12-08: 14:00:00

## 2011-12-08 MED ORDER — DIPHENHYDRAMINE HCL 12.5 MG/5ML PO ELIX
12.5000 mg | ORAL_SOLUTION | Freq: Four times a day (QID) | ORAL | Status: DC | PRN
  Filled 2011-12-08: qty 5

## 2011-12-08 MED ORDER — NALOXONE HCL 0.4 MG/ML IJ SOLN: 0.4000 mg | INTRAMUSCULAR | Status: DC | PRN

## 2011-12-08 MED ORDER — SODIUM CHLORIDE 0.9 % IJ SOLN: 9.0000 mL | INTRAMUSCULAR | Status: DC | PRN

## 2011-12-08 MED ORDER — DROPERIDOL 2.5 MG/ML IJ SOLN: 0.6250 mg | INTRAMUSCULAR | Status: DC | PRN

## 2011-12-08 MED ORDER — SUFENTANIL CITRATE 50 MCG/ML IV SOLN
INTRAVENOUS | Status: DC | PRN
  Administered 2011-12-08: 5 ug via INTRAVENOUS
  Administered 2011-12-08 (×2): 10 ug via INTRAVENOUS
  Administered 2011-12-08 (×3): 5 ug via INTRAVENOUS
  Administered 2011-12-08: 10 ug via INTRAVENOUS

## 2011-12-08 MED ORDER — ONDANSETRON HCL 4 MG PO TABS: 4.0000 mg | ORAL_TABLET | Freq: Four times a day (QID) | ORAL | Status: DC | PRN

## 2011-12-08 MED ORDER — HETASTARCH-ELECTROLYTES 6 % IV SOLN
INTRAVENOUS | Status: DC | PRN
  Administered 2011-12-08: 15:00:00 via INTRAVENOUS

## 2011-12-08 MED ORDER — ONDANSETRON HCL 4 MG/2ML IJ SOLN: 4.0000 mg | Freq: Four times a day (QID) | INTRAMUSCULAR | Status: DC | PRN

## 2011-12-08 MED ORDER — ACETAMINOPHEN 10 MG/ML IV SOLN
INTRAVENOUS | Status: DC | PRN
  Administered 2011-12-08: 1000 mg via INTRAVENOUS

## 2011-12-08 MED ORDER — 0.9 % SODIUM CHLORIDE (POUR BTL) OPTIME
TOPICAL | Status: DC | PRN
  Administered 2011-12-08: 2000 mL

## 2011-12-08 MED ORDER — DEXAMETHASONE SODIUM PHOSPHATE 4 MG/ML IJ SOLN
INTRAMUSCULAR | Status: DC | PRN
  Administered 2011-12-08: 8 mg via INTRAVENOUS

## 2011-12-08 MED ORDER — LACTATED RINGERS IV SOLN
INTRAVENOUS | Status: DC
  Administered 2011-12-08: 12:00:00 via INTRAVENOUS

## 2011-12-08 MED ORDER — SCOPOLAMINE 1 MG/3DAYS TD PT72
1.0000 | MEDICATED_PATCH | TRANSDERMAL | Status: DC
  Administered 2011-12-08 – 2011-12-11 (×2): 1.5 mg via TRANSDERMAL
  Filled 2011-12-08 (×2): qty 1

## 2011-12-08 MED ORDER — ENOXAPARIN SODIUM 40 MG/0.4ML ~~LOC~~ SOLN
40.0000 mg | SUBCUTANEOUS | Status: DC
  Administered 2011-12-09 – 2011-12-12 (×4): 40 mg via SUBCUTANEOUS
  Filled 2011-12-08 (×5): qty 0.4

## 2011-12-08 MED ORDER — GLYCOPYRROLATE 0.2 MG/ML IJ SOLN
INTRAMUSCULAR | Status: DC | PRN
  Administered 2011-12-08: .5 mg via INTRAVENOUS

## 2011-12-08 MED ORDER — CEFAZOLIN SODIUM 1-5 GM-% IV SOLN
1.0000 g | Freq: Four times a day (QID) | INTRAVENOUS | Status: AC
  Administered 2011-12-08 – 2011-12-09 (×3): 1 g via INTRAVENOUS
  Filled 2011-12-08 (×6): qty 50

## 2011-12-08 MED ORDER — LACTATED RINGERS IV SOLN
INTRAVENOUS | Status: DC | PRN
  Administered 2011-12-08 (×3): via INTRAVENOUS

## 2011-12-08 SURGICAL SUPPLY — 75 items
ADH SKN CLS LQ APL DERMABOND (GAUZE/BANDAGES/DRESSINGS) ×4
APPLIER CLIP 9.375 MED OPEN (MISCELLANEOUS)
APR CLP MED 9.3 20 MLT OPN (MISCELLANEOUS)
BAG DECANTER FOR FLEXI CONT (MISCELLANEOUS) ×3 IMPLANT
BINDER ABD UNIV 12 45-62 (WOUND CARE) ×1 IMPLANT
BINDER ABDOMINAL 46IN 62IN (WOUND CARE) ×3
BLADE SURG ROTATE 9660 (MISCELLANEOUS) ×2 IMPLANT
BRUSH CYTOL CELLEBRITY 1.5X140 (MISCELLANEOUS) ×2 IMPLANT
CANISTER SUCTION 2500CC (MISCELLANEOUS) ×3 IMPLANT
CHLORAPREP W/TINT 26ML (MISCELLANEOUS) ×3 IMPLANT
CLIP APPLIE 9.375 MED OPEN (MISCELLANEOUS) ×1 IMPLANT
CLOTH BEACON ORANGE TIMEOUT ST (SAFETY) ×3 IMPLANT
COATED VICRYL PLUS ANTIBACTERIAL ×2 IMPLANT
CORDS BIPOLAR (ELECTRODE) ×3 IMPLANT
COVER SURGICAL LIGHT HANDLE (MISCELLANEOUS) ×3 IMPLANT
DERMABOND ADHESIVE PROPEN (GAUZE/BANDAGES/DRESSINGS) ×2
DERMABOND ADVANCED .7 DNX6 (GAUZE/BANDAGES/DRESSINGS) ×2 IMPLANT
DRAIN CHANNEL 19F RND (DRAIN) ×6 IMPLANT
DRAPE LAPAROSCOPIC ABDOMINAL (DRAPES) ×2 IMPLANT
DRAPE ORTHO SPLIT 77X108 STRL (DRAPES) ×6
DRAPE PROXIMA HALF (DRAPES) IMPLANT
DRAPE SURG 17X11 SM STRL (DRAPES) ×2 IMPLANT
DRAPE SURG ORHT 6 SPLT 77X108 (DRAPES) ×4 IMPLANT
DRAPE WARM FLUID 44X44 (DRAPE) ×3 IMPLANT
DRSG PAD ABDOMINAL 8X10 ST (GAUZE/BANDAGES/DRESSINGS) ×8 IMPLANT
ELECT BLADE 6.5 EXT (BLADE) IMPLANT
ELECT CAUTERY BLADE 6.4 (BLADE) ×4 IMPLANT
ELECT REM PT RETURN 9FT ADLT (ELECTROSURGICAL) ×3
ELECTRODE REM PT RTRN 9FT ADLT (ELECTROSURGICAL) ×2 IMPLANT
EVACUATOR SILICONE 100CC (DRAIN) ×4 IMPLANT
GLOVE BIO SURGEON STRL SZ 6 (GLOVE) ×5 IMPLANT
GLOVE BIO SURGEON STRL SZ 6.5 (GLOVE) ×4 IMPLANT
GLOVE BIO SURGEON STRL SZ7.5 (GLOVE) ×6 IMPLANT
GLOVE BIO SURGEON STRL SZ8 (GLOVE) ×2 IMPLANT
GLOVE BIOGEL PI IND STRL 6.5 (GLOVE) ×3 IMPLANT
GLOVE BIOGEL PI IND STRL 7.5 (GLOVE) ×2 IMPLANT
GLOVE BIOGEL PI IND STRL 8.5 (GLOVE) ×1 IMPLANT
GLOVE BIOGEL PI INDICATOR 6.5 (GLOVE) ×2
GLOVE BIOGEL PI INDICATOR 7.5 (GLOVE) ×2
GLOVE BIOGEL PI INDICATOR 8.5 (GLOVE) ×1
GOWN PREVENTION PLUS XXLARGE (GOWN DISPOSABLE) ×3 IMPLANT
GOWN STRL NON-REIN LRG LVL3 (GOWN DISPOSABLE) ×8 IMPLANT
KIT BASIN OR (CUSTOM PROCEDURE TRAY) ×6 IMPLANT
KIT ROOM TURNOVER OR (KITS) ×3 IMPLANT
NDL HYPO 25GX1X1/2 BEV (NEEDLE) ×1 IMPLANT
NEEDLE HYPO 25GX1X1/2 BEV (NEEDLE) ×3 IMPLANT
NS IRRIG 1000ML POUR BTL (IV SOLUTION) ×3 IMPLANT
PACK GENERAL/GYN (CUSTOM PROCEDURE TRAY) ×3 IMPLANT
PAD ARMBOARD 7.5X6 YLW CONV (MISCELLANEOUS) ×6 IMPLANT
PEN SKIN MARKING BROAD (MISCELLANEOUS) ×3 IMPLANT
SPONGE GAUZE 4X4 12PLY (GAUZE/BANDAGES/DRESSINGS) ×4 IMPLANT
SPONGE LAP 18X18 X RAY DECT (DISPOSABLE) ×4 IMPLANT
STAPLER VISISTAT 35W (STAPLE) ×1 IMPLANT
STRIP CLOSURE SKIN 1/2X4 (GAUZE/BANDAGES/DRESSINGS) IMPLANT
SUT ETHIBOND NAB CTX #1 30IN (SUTURE) ×4 IMPLANT
SUT ETHILON 2 0 FS 18 (SUTURE) ×4 IMPLANT
SUT MNCRL AB 3-0 PS2 18 (SUTURE) IMPLANT
SUT MNCRL AB 4-0 PS2 18 (SUTURE) ×3 IMPLANT
SUT MON AB 5-0 PS2 18 (SUTURE) ×4 IMPLANT
SUT NOVA NAB DX-16 0-1 5-0 T12 (SUTURE) ×6 IMPLANT
SUT NOVA NAB GS-21 1 T12 (SUTURE) ×2 IMPLANT
SUT PDS AB 1 CTX 36 (SUTURE) ×4 IMPLANT
SUT PDS AB 2-0 CT1 27 (SUTURE) IMPLANT
SUT PDS AB 3-0 SH 27 (SUTURE) IMPLANT
SUT VIC AB 3-0 PS2 18 (SUTURE) ×12
SUT VIC AB 3-0 PS2 18XBRD (SUTURE) ×4 IMPLANT
SUT VIC AB 3-0 SH 18 (SUTURE) IMPLANT
SUT VIC AB 3-0 SH 27 (SUTURE) ×6
SUT VIC AB 3-0 SH 27X BRD (SUTURE) ×2 IMPLANT
SUT VICRYL 4-0 PS2 18IN ABS (SUTURE) ×4 IMPLANT
TAPE CLOTH SURG 4X10 WHT LF (GAUZE/BANDAGES/DRESSINGS) ×2 IMPLANT
TOWEL OR 17X24 6PK STRL BLUE (TOWEL DISPOSABLE) ×3 IMPLANT
TOWEL OR 17X26 10 PK STRL BLUE (TOWEL DISPOSABLE) ×3 IMPLANT
TRAY FOLEY CATH 14FRSI W/METER (CATHETERS) ×2 IMPLANT
WATER STERILE IRR 1000ML POUR (IV SOLUTION) IMPLANT

## 2011-12-08 NOTE — Progress Notes (Signed)
reviewd required labs w/dr singer.no other studies required

## 2011-12-08 NOTE — Brief Op Note (Signed)
12/08/2011  4:07 PM  PATIENT:  Stacie Clayton  50 y.o. female  PRE-OPERATIVE DIAGNOSIS:  Ventral hernia and rectus diastasis   POST-OPERATIVE DIAGNOSIS:  Ventral hernia  PROCEDURE:  Procedure(s): HERNIA REPAIR VENTRAL ADULT LYSIS OF ADHESIONS  SURGEON:  Surgeon(s): Almond Lint, MD Alan Ripper Sanger, DO  ANESTHESIA:   general  EBL:  Total I/O In: 3400 [I.V.:2900; IV Piggyback:500] Out: 160 [Urine:60; Blood:100]  DRAINS: (2) Blake drain(s) in the subcutaneous   LOCAL MEDICATIONS USED:  NONE  SPECIMEN:  No Specimen  DISPOSITION OF SPECIMEN:  N/A  COUNTS:  YES  DICTATION: .Other Dictation: Dictation Number [id number]  PLAN OF CARE: Admit to inpatient   PATIENT DISPOSITION:  PACU - hemodynamically stable.

## 2011-12-08 NOTE — Anesthesia Preprocedure Evaluation (Signed)
Anesthesia Evaluation  Patient identified by MRN, date of birth, ID band Patient awake    Reviewed: Allergy & Precautions, H&P , NPO status , Patient's Chart, lab work & pertinent test results, reviewed documented beta blocker date and time   History of Anesthesia Complications (+) PONV  Airway Mallampati: I TM Distance: >3 FB Neck ROM: Full    Dental   Pulmonary neg pulmonary ROS,  clear to auscultation  Pulmonary exam normal       Cardiovascular neg cardio ROS Regular Normal- Systolic murmurs    Neuro/Psych    GI/Hepatic Neg liver ROS, Ventral hernia    Endo/Other  Negative Endocrine ROS  Renal/GU negative Renal ROS     Musculoskeletal   Abdominal   Peds  Hematology   Anesthesia Other Findings   Reproductive/Obstetrics                           Anesthesia Physical Anesthesia Plan  ASA: II  Anesthesia Plan: General   Post-op Pain Management:    Induction: Intravenous  Airway Management Planned: Oral ETT  Additional Equipment:   Intra-op Plan:   Post-operative Plan: Extubation in OR  Informed Consent: I have reviewed the patients History and Physical, chart, labs and discussed the procedure including the risks, benefits and alternatives for the proposed anesthesia with the patient or authorized representative who has indicated his/her understanding and acceptance.   Dental advisory given  Plan Discussed with: CRNA and Surgeon  Anesthesia Plan Comments:         Anesthesia Quick Evaluation

## 2011-12-08 NOTE — Transfer of Care (Signed)
Immediate Anesthesia Transfer of Care Note  Patient: Stacie Clayton  Procedure(s) Performed:  HERNIA REPAIR VENTRAL ADULT - OPEN VENTRAL HERNIA REPAIR WITH LYSIS OF ADHESIONS; LYSIS OF ADHESION  Patient Location: PACU  Anesthesia Type: General  Level of Consciousness: sedated  Airway & Oxygen Therapy: Patient Spontanous Breathing and Patient connected to nasal cannula oxygen  Post-op Assessment: Report given to PACU RN and Post -op Vital signs reviewed and stable  Post vital signs: Reviewed and stable  Complications: No apparent anesthesia complications

## 2011-12-08 NOTE — Anesthesia Postprocedure Evaluation (Signed)
Anesthesia Post Note  Patient: Stacie Clayton  Procedure(s) Performed:  HERNIA REPAIR VENTRAL ADULT - OPEN VENTRAL HERNIA REPAIR WITH LYSIS OF ADHESIONS; LYSIS OF ADHESION  Anesthesia type: General  Patient location: PACU  Post pain: Pain level controlled  Post assessment: Patient's Cardiovascular Status Stable  Last Vitals:  Filed Vitals:   12/08/11 1700  BP: 132/60  Pulse: 55  Temp:   Resp: 19    Post vital signs: Reviewed and stable  Level of consciousness: sedated  Complications: No apparent anesthesia complications

## 2011-12-08 NOTE — H&P (Signed)
Stacie Clayton is an 50 y.o. female.   Chief Complaint: Ventral hernia  HPI:  Pt is a 50 year old female with a history of rectus diastasis who had an abdominal catastrophe this summer.  She had a duodenal perforation after an ERCP and a very large retroperitoneal abscess.  She underwent patch repair and drainage.  She had multiple post op abscesses.  She then developed a hernia, which was not unexpected given the level of infection that was present.  This has gotten larger and has become painful.    Past Medical History  Diagnosis Date  . Weight loss   . Hyperlipidemia     normal now but has had in the past...per pt  . Abdominal wall bulge     epigastric bluge that is more noticable after eating and drinking and is  painful to the touch   . Abdominal pain, right lateral     right flank pain that goes around to back   . Incisional hernia   . Cancer     melanoma on back  . Duodenal perforation   . Complication of anesthesia   . PONV (postoperative nausea and vomiting)   . Blood transfusion 06/2011-4 UNITS rbc'S, 2 uNITS FROZEN PLASMA    Past Surgical History  Procedure Date  . Tubal reversal     tubal reversal  . Melanoma excision     back  . Cholecystectomy   . Tubal ligation 1986  . Ercp w/ sphicterotomy 06/14/11  . Repair of duodenal perforation Perforation from ERCP    Family History  Problem Relation Age of Onset  . Cancer Mother     lung  . Heart disease Father     heart attack   Social History:  reports that she quit smoking about 25 years ago. She does not have any smokeless tobacco history on file. She reports that she does not drink alcohol or use illicit drugs.  Allergies: No Known Allergies  Medications Prior to Admission  Medication Dose Route Frequency Provider Last Rate Last Dose  . 0.9 % irrigation (POUR BTL)    PRN Almond Lint, MD   2,000 mL at 12/08/11 1221  . ceFAZolin (ANCEF) IVPB 1 g/50 mL premix  1 g Intravenous 60 min Pre-Op Almond Lint, MD       . lactated ringers infusion   Intravenous Continuous Remonia Richter, MD 50 mL/hr at 12/08/11 1200    . scopolamine (TRANSDERM-SCOP) 1.5 MG 1.5 mg  1 patch Transdermal Q72H Remonia Richter, MD       No current outpatient prescriptions on file as of 12/08/2011.    No results found for this or any previous visit (from the past 48 hour(s)). No results found.  Review of Systems  All other systems reviewed and are negative.    Blood pressure 126/76, pulse 67, temperature 97.8 F (36.6 C), temperature source Oral, resp. rate 20, SpO2 99.00%. Physical Exam  Constitutional: She is oriented to person, place, and time. She appears well-developed and well-nourished. No distress.  HENT:  Head: Normocephalic and atraumatic.  Eyes: Pupils are equal, round, and reactive to light. No scleral icterus.  Neck: Normal range of motion.  Cardiovascular: Normal rate.   Respiratory: Effort normal. No respiratory distress.  GI: Soft. She exhibits no distension and no mass. There is tenderness (at edge of hernia in RUQ). There is no rebound and no guarding.  Musculoskeletal: Normal range of motion. She exhibits no edema.  Neurological: She is  alert and oriented to person, place, and time. Coordination normal.  Skin: Skin is warm and dry. She is not diaphoretic.  Psychiatric: She has a normal mood and affect. Her behavior is normal. Judgment and thought content normal.     Assessment/Plan Ventral hernia  Plan open repair in order to bring muscle back together.    Will do in conjunction with Dr. Kelly Splinter.    Have discussed risks of mesh infection, recurrent hernia, pain, drain placement and recovery period.    Charrie Mcconnon 12/08/2011, 12:26 PM

## 2011-12-08 NOTE — Brief Op Note (Signed)
12/08/2011  1:05 PM  PATIENT:  Stacie Clayton  50 y.o. female  PRE-OPERATIVE DIAGNOSIS:  ventral hernia   POST-OPERATIVE DIAGNOSIS:  * No post-op diagnosis entered *  PROCEDURE:  Procedure(s): HERNIA REPAIR VENTRAL ADULT  SURGEON:  Surgeon(s): Almond Lint, MD Wayland Denis, DO  PHYSICIAN ASSISTANT: none  ASSISTANTS: Dr. Wayland Denis   ANESTHESIA:   general  EBL: 150cc    BLOOD ADMINISTERED:none  DRAINS: (19) Jackson-Pratt drain(s) with closed bulb suction in the abdominal wall x 2   LOCAL MEDICATIONS USED:  NONE  SPECIMEN:  No Specimen  DISPOSITION OF SPECIMEN:  PATHOLOGY  COUNTS:  YES  TOURNIQUET:  * No tourniquets in log *  DICTATION: .Other Dictation: Dictation Number dictated  PLAN OF CARE: Admit to inpatient   PATIENT DISPOSITION:  PACU - hemodynamically stable.   Delay start of Pharmacological VTE agent (>24hrs) due to surgical blood loss or risk of bleeding:  NO

## 2011-12-08 NOTE — H&P (Signed)
HPI  Mrs. Mentzer is a 50 yrs old wf here with her husband for history and physical for repair of abdominal wall hernia. She underwent a gallbladder operation 12/16/10. Three months later she had an attack with abdominal pain and again 6 months later. The ERCP was initially negative but the liver enzymes were elevated and showed a perforated in July. An exploratory lap was done and she was in the hospital for 2 weeks. She then noted the hernia 9/12.  The following portions of the patient's history were reviewed and updated as appropriate: allergies, current medications, past family history, past medical history, past social history, past surgical history and problem list.  Review of Systems  Constitutional: Negative.  HENT: Negative.  Eyes: Negative.  Respiratory: Negative.  Cardiovascular: Negative.  Gastrointestinal: Negative.  Genitourinary: Negative.  Musculoskeletal: Negative.  Skin: Negative.  Neurological: Negative.  Hematological: Negative.  Psychiatric/Behavioral: Negative.  Objective:   Physical Exam  Constitutional: She is oriented to person, place, and time. She appears well-developed and well-nourished.  HENT:  Head: Normocephalic and atraumatic.  Eyes: Conjunctivae and EOM are normal. Pupils are equal, round, and reactive to light.  Neck: Normal range of motion.  Cardiovascular: Normal rate.  Pulmonary/Chest: Effort normal. No respiratory distress.  Abdominal: Soft. She exhibits no distension and no mass. There is no tenderness. There is no rebound and no guarding.  Very large abominal hernia on the right upper quadrant toward the midline. Rectus muscles with vast separation. Not tender.  Musculoskeletal: Normal range of motion.  Neurological: She is oriented to person, place, and time.  Skin: Skin is warm.  Psychiatric: She has a normal mood and affect. Her behavior is normal. Judgment and thought content normal.  Assessment:    1.  Abdominal hernia    Plan:   Assist  Dr. Donell Beers with abdominal hernia repair.  Possible component separation with reconstruction of the abdominal wall.  No aggressive removal of skin other than in the vertical plan. Patient marked.  No change in history and physical.

## 2011-12-08 NOTE — Op Note (Signed)
NAMEMILLISA, Stacie Clayton              ACCOUNT NO.:  [id number]  MEDICAL RECORD NO.:  [id number]  LOCATION:  5148                         FACILITY:  [facility]  PHYSICIAN:  Almond Lint, MD       DATE OF BIRTH:  Jan 06, 1961  DATE OF PROCEDURE:  12/08/2011 DATE OF DISCHARGE:                              OPERATIVE REPORT   PREOPERATIVE DIAGNOSES: 1. Ventral hernia. 2. Rectus diastasis.  POSTOPERATIVE DIAGNOSES: 1. Ventral hernia. 2. Rectus diastasis.  PROCEDURE PERFORMED:  Primary open ventral hernia repair and lysis of adhesions x30 minutes.  SURGEON:  Almond Lint, MD  ASSISTANT:  Wayland Denis, DO  ANESTHESIA:  General.  ESTIMATED BLOOD LOSS:  100.  URINE OUTPUT:  60 mL.  IV FLUIDS:  2900 of crystalloids, 500 of Hextend.  DRAINS:  Two Blake drains in the subcutaneous space.  DESCRIPTION OF PROCEDURE:  [name] was identified in the holding area and taken to the operating room where she was placed supine on the operating table.  General anesthesia was induced.  Her abdomen was clipped, prepped, and draped in standard fashion.  Time-out was performed according to the surgical safety checklist.  When all was correct, we continued.  The prior incision was excised with a #10 blade. The subcutaneous tissue was divided with the cautery.  The midline tissue was elevated and opened with the Metzenbaum scissors.  This was opened cautiously.  Once the fascia was encountered, this was elevated and these adhesions were taken down with a combination of Metzenbaum scissors and cautery.    Primarily, there were adhesions of the omentum to the abdominal wall.  However, there were several loops of bowel adherent to the abdominal wall.  The omentum was divided in the middle to prevent bowel obstruction through the holes in the omentum.  The falciform ligament was taken down.  The adhesions were taken off  the entire anterior abdominal wall.  Once this was done, the edges of the  rectus were identified laterally and the tissue medial to this was taken off anteriorly. This allowed the rectus edge and the fascial edges to be freed up.  Once this was done on both sides, then the rectus had been freed up from the superficial tissue all the way to the anterior axillary line, the muscles were reapproximated with a running #1 PDS suture.  There were interrupted sutures.  There were #1 Novafil pops that were placed periodically for fascial retention sutures.  Once this was done, a #1 Ethibond was used to imbricate the suture knots.  The two 19-Blake drains were placed on either side through the C-section scar.  These were placed lateral in the skin flaps.  The abdominal wall was then tacked down with 3-0 Vicryl in several layers.  Fibrin glue was also used to adhere and the flaps down to the fascia.  The skin was reapproximated using interrupted 3-0 Vicryl sutures, 4-0 Vicryl sutures, and then run using 5-0 Monocryl in subcuticular fashion.  The skin was then cleaned, dried, and dressed with Dermabond.  The patient had an abdominal binder placed and was extubated on her bed with the abdominal binder.  She was awakened from the anesthesia and taken  to the PACU in stable condition.  Needle, sponge, and instrument counts were correct x2.     Almond Lint, MD     FB/MEDQ  D:  12/08/2011  T:  12/08/2011  Job:  [id number]

## 2011-12-08 NOTE — Preoperative (Signed)
Beta Blockers   Reason not to administer Beta Blockers:Not Applicable 

## 2011-12-09 ENCOUNTER — Encounter (HOSPITAL_COMMUNITY): Payer: Self-pay | Admitting: General Surgery

## 2011-12-09 DIAGNOSIS — K469 Unspecified abdominal hernia without obstruction or gangrene: Secondary | ICD-10-CM

## 2011-12-09 LAB — BASIC METABOLIC PANEL
BUN: 12 mg/dL (ref 6–23)
CO2: 28 mEq/L (ref 19–32)
Chloride: 101 mEq/L (ref 96–112)
Creatinine, Ser: 0.77 mg/dL (ref 0.50–1.10)
Glucose, Bld: 133 mg/dL — ABNORMAL HIGH (ref 70–99)

## 2011-12-09 LAB — CBC
HCT: 29.6 % — ABNORMAL LOW (ref 36.0–46.0)
Hemoglobin: 9.7 g/dL — ABNORMAL LOW (ref 12.0–15.0)
MCH: 29.3 pg (ref 26.0–34.0)
MCV: 89.4 fL (ref 78.0–100.0)
RBC: 3.31 MIL/uL — ABNORMAL LOW (ref 3.87–5.11)

## 2011-12-09 MED ORDER — CHLORHEXIDINE GLUCONATE 0.12 % MT SOLN
15.0000 mL | Freq: Two times a day (BID) | OROMUCOSAL | Status: DC
  Administered 2011-12-09 – 2011-12-13 (×9): 15 mL via OROMUCOSAL
  Filled 2011-12-09 (×5): qty 15

## 2011-12-09 MED ORDER — PHENOL 1.4 % MT LIQD
1.0000 | OROMUCOSAL | Status: DC | PRN
  Administered 2011-12-09: 1 via OROMUCOSAL
  Filled 2011-12-09: qty 177

## 2011-12-09 MED ORDER — BIOTENE DRY MOUTH MT LIQD
15.0000 mL | Freq: Two times a day (BID) | OROMUCOSAL | Status: DC
  Administered 2011-12-09 – 2011-12-12 (×7): 15 mL via OROMUCOSAL

## 2011-12-09 MED ORDER — WHITE PETROLATUM GEL
Status: AC
  Administered 2011-12-09: 06:00:00
  Filled 2011-12-09: qty 5

## 2011-12-09 NOTE — Progress Notes (Signed)
1 Day Post-Op  Subjective: Slightly sleepy, pain controlled in abd.  C/o sore throat.    Objective: Vital signs in last 24 hours: Temp:  [97.8 F (36.6 C)-98.4 F (36.9 C)] 98.3 F (36.8 C) (12/21 0600) Pulse Rate:  [55-87] 83  (12/21 0600) Resp:  [12-20] 18  (12/21 0834) BP: (121-142)/(60-82) 121/69 mmHg (12/21 0600) SpO2:  [95 %-100 %] 100 % (12/21 0834) Weight:  [133 lb 6.1 oz (60.5 kg)] 133 lb 6.1 oz (60.5 kg) (12/21 0354) Last BM Date: 12/08/11  Intake/Output from previous day: 12/20 0701 - 12/21 0700 In: 5215.5 [I.V.:4385.5; NG/GT:30; IV Piggyback:800] Out: 1025 [Urine:585; Emesis/NG output:200; Drains:140; Blood:100] Intake/Output this shift:    General appearance: alert, cooperative and no distress Head: Normocephalic, without obvious abnormality, atraumatic Chest wall: no tenderness GI: binder in place.  appropriately tender, drains serosang.    Lab Results:   [facility] 12/09/11 0538 12/08/11 1827  WBC 8.4 9.3  HGB 9.7* 10.8*  HCT 29.6* 32.9*  PLT 267 212   BMET  Basename 12/09/11 0538 12/08/11 1827  NA 134* --  K 4.8 --  CL 101 --  CO2 28 --  GLUCOSE 133* --  BUN 12 --  CREATININE 0.77 0.84  CALCIUM 8.8 --   PT/INR No results found for this basename: LABPROT:2,INR:2 in the last 72 hours ABG No results found for this basename: PHART:2,PCO2:2,PO2:2,HCO3:2 in the last 72 hours  Studies/Results: No results found.  Anti-infectives: Anti-infectives     Start     Dose/Rate Route Frequency Ordered Stop   12/08/11 1830   ceFAZolin (ANCEF) IVPB 1 g/50 mL premix        1 g 100 mL/hr over 30 Minutes Intravenous Every 6 hours 12/08/11 1746 12/09/11 1610   12/08/11 1405   polymyxin B 500,000 Units, bacitracin 50,000 Units in sodium chloride irrigation 0.9 % 500 mL irrigation  Status:  Discontinued          As needed 12/08/11 1407 12/08/11 1605   12/07/11 1515   ceFAZolin (ANCEF) IVPB 1 g/50 mL premix        1 g 100 mL/hr over 30 Minutes Intravenous 60  min pre-op 12/07/11 1510 12/08/11 1303          Assessment/Plan: s/p Procedure(s): Primary HERNIA REPAIR VENTRAL ADULT LYSIS OF ADHESION Keep NGT today.   Add chloraseptic throat spray. May be out of bed once today. D/C foley tomorrow.   PCA for pain control     LOS: 1 day    [facility] [place] 12/09/2011

## 2011-12-09 NOTE — Progress Notes (Signed)
1 Day Post-Op  Subjective: Patient doing well in the room with husband present.  N/G in place and draining.  Drains in place and stripped so they are draining better now with an additional 60cc out.  Objective: Vital signs in last 24 hours: Temp:  [97.8 F (36.6 C)-98.4 F (36.9 C)] 98.3 F (36.8 C) (12/21 0600) Pulse Rate:  [55-87] 83  (12/21 0600) Resp:  [12-20] 18  (12/21 0834) BP: (121-142)/(60-82) 121/69 mmHg (12/21 0600) SpO2:  [95 %-100 %] 100 % (12/21 0834) Weight:  [60.5 kg (133 lb 6.1 oz)] 133 lb 6.1 oz (60.5 kg) (12/21 0354) Last BM Date: 12/08/11  Intake/Output from previous day: 12/20 0701 - 12/21 0700 In: 5215.5 [I.V.:4385.5; NG/GT:30; IV Piggyback:800] Out: 1025 [Urine:585; Emesis/NG output:200; Drains:140; Blood:100] Intake/Output this shift:    General appearance: alert, cooperative and no distress Incision/Wound:dry and intact.  Lab Results:   [facility] 12/09/11 0538 12/08/11 1827  WBC 8.4 9.3  HGB 9.7* 10.8*  HCT 29.6* 32.9*  PLT 267 212   BMET  Basename 12/09/11 0538 12/08/11 1827  NA 134* --  K 4.8 --  CL 101 --  CO2 28 --  GLUCOSE 133* --  BUN 12 --  CREATININE 0.77 0.84  CALCIUM 8.8 --   PT/INR No results found for this basename: LABPROT:2,INR:2 in the last 72 hours ABG No results found for this basename: PHART:2,PCO2:2,PO2:2,HCO3:2 in the last 72 hours  Studies/Results: No results found.  Anti-infectives: Anti-infectives     Start     Dose/Rate Route Frequency Ordered Stop   12/08/11 1830   ceFAZolin (ANCEF) IVPB 1 g/50 mL premix        1 g 100 mL/hr over 30 Minutes Intravenous Every 6 hours 12/08/11 1746 12/09/11 0652   12/08/11 1405   polymyxin B 500,000 Units, bacitracin 50,000 Units in sodium chloride irrigation 0.9 % 500 mL irrigation  Status:  Discontinued          As needed 12/08/11 1407 12/08/11 1605   12/07/11 1515   ceFAZolin (ANCEF) IVPB 1 g/50 mL premix        1 g 100 mL/hr over 30 Minutes Intravenous 60 min pre-op  12/07/11 1510 12/08/11 1303          Assessment/Plan: s/p Procedure(s): HERNIA REPAIR VENTRAL ADULT LYSIS OF ADHESION d/c foley  LOS: 1 day    [facility] 12/09/2011

## 2011-12-10 LAB — CBC
HCT: 28.8 % — ABNORMAL LOW (ref 36.0–46.0)
MCHC: 32.3 g/dL (ref 30.0–36.0)
MCV: 90.9 fL (ref 78.0–100.0)
RDW: 14 % (ref 11.5–15.5)

## 2011-12-10 LAB — BASIC METABOLIC PANEL
BUN: 9 mg/dL (ref 6–23)
Creatinine, Ser: 0.61 mg/dL (ref 0.50–1.10)
GFR calc Af Amer: >90 mL/min (ref >90)
GFR calc non Af Amer: >90 mL/min (ref >90)
Glucose, Bld: 131 mg/dL — ABNORMAL HIGH (ref 70–99)

## 2011-12-10 NOTE — Progress Notes (Signed)
2 Days Post-Op  Subjective: Still a good bit of output from NG, thin bilious. Pain control ok.  Objective: Vital signs in last 24 hours: Temp:  [97.9 F (36.6 C)-98.6 F (37 C)] 98 F (36.7 C) (12/22 0650) Pulse Rate:  [67-90] 90  (12/22 0650) Resp:  [16-20] 18  (12/22 0650) BP: (114-129)/(63-74) 114/63 mmHg (12/22 0650) SpO2:  [95 %-100 %] 98 % (12/22 0650) Last BM Date: 12/08/11  Intake/Output this shift:    Physical Exam: BP 114/63  Pulse 90  Temp(Src) 98 F (36.7 C) (Oral)  Resp 18  Ht 5\' 1"  (1.549 m)  Wt 133 lb 6.1 oz (60.5 kg)  BMI 25.20 kg/m2  SpO2 98% Abdomen: soft, mild distention. Quiet BS Incision c/d/i  Labs: CBC  Basename 12/10/11 0705 12/09/11 0538  WBC 8.3 8.4  HGB 9.3* 9.7*  HCT 28.8* 29.6*  PLT 271 267   BMET  Basename 12/09/11 0538 12/08/11 1827  NA 134* --  K 4.8 --  CL 101 --  CO2 28 --  GLUCOSE 133* --  BUN 12 --  CREATININE 0.77 0.84  CALCIUM 8.8 --   LFT No results found for this basename: PROT,ALBUMIN,AST,ALT,ALKPHOS,BILITOT,BILIDIR,IBILI,LIPASE in the last 72 hours PT/INR No results found for this basename: LABPROT:2,INR:2 in the last 72 hours ABG No results found for this basename: PHART:2,PCO2:2,PO2:2,HCO3:2 in the last 72 hours  Studies/Results: No results found.  Assessment: Active Problems:  Abdominal hernia Procedure(s): HERNIA REPAIR VENTRAL ADULT LYSIS OF ADHESION  Plan: Will DC foley today Trial clamping NG Encouraged activity. Discussed attempt to reduce pain med use.  LOS: 2 days    Iona Coach 12/10/2011

## 2011-12-11 LAB — CBC
HCT: 24.2 % — ABNORMAL LOW (ref 36.0–46.0)
MCH: 30.1 pg (ref 26.0–34.0)
MCHC: 33.5 g/dL (ref 30.0–36.0)
RDW: 13.8 % (ref 11.5–15.5)

## 2011-12-11 MED ORDER — HYDROCODONE-ACETAMINOPHEN 5-325 MG PO TABS
1.0000 | ORAL_TABLET | ORAL | Status: DC | PRN
  Administered 2011-12-11 – 2011-12-13 (×7): 1 via ORAL
  Filled 2011-12-11 (×7): qty 1

## 2011-12-11 NOTE — Progress Notes (Signed)
3 Days Post-Op  Subjective: NG removed sips clears started  Objective: Vital signs in last 24 hours: Temp:  [97.9 F (36.6 C)-98.8 F (37.1 C)] 98.3 F (36.8 C) (12/23 0635) Pulse Rate:  [69-106] 69  (12/23 0635) Resp:  [18-20] 18  (12/23 0635) BP: (108-127)/(60-74) 108/60 mmHg (12/23 0635) SpO2:  [97 %-99 %] 97 % (12/23 0635) Last BM Date: 12/08/11  Intake/Output this shift:    Physical Exam: BP 108/60  Pulse 69  Temp(Src) 98.3 F (36.8 C) (Oral)  Resp 18  Ht 5\' 1"  (1.549 m)  Wt 133 lb 6.1 oz (60.5 kg)  BMI 25.20 kg/m2  SpO2 97% Soft few bowel sound  Labs: CBC  Basename 12/11/11 0605 12/10/11 0705  WBC 6.7 8.3  HGB 8.1* 9.3*  HCT 24.2* 28.8*  PLT 239 271   BMET  Basename 12/10/11 0705 12/09/11 0538  NA 138 134*  K 4.4 4.8  CL 104 101  CO2 30 28  GLUCOSE 131* 133*  BUN 9 12  CREATININE 0.61 0.77  CALCIUM 8.9 8.8   LFT No results found for this basename: PROT,ALBUMIN,AST,ALT,ALKPHOS,BILITOT,BILIDIR,IBILI,LIPASE in the last 72 hours PT/INR No results found for this basename: LABPROT:2,INR:2 in the last 72 hours ABG No results found for this basename: PHART:2,PCO2:2,PO2:2,HCO3:2 in the last 72 hours  Studies/Results: No results found.  Assessment: Active Problems:  Abdominal hernia   Procedure(s): HERNIA REPAIR VENTRAL ADULT LYSIS OF ADHESION  Plan: Start diet decrease iv labs in AM Home ? AM. Dr Krista Blue to see pt today  LOS: 3 days    Keng Jewel J 12/11/2011

## 2011-12-12 LAB — CBC
MCH: 29.8 pg (ref 26.0–34.0)
MCV: 90.3 fL (ref 78.0–100.0)
Platelets: 225 10*3/uL (ref 150–400)
RBC: 2.48 MIL/uL — ABNORMAL LOW (ref 3.87–5.11)

## 2011-12-12 LAB — BASIC METABOLIC PANEL
CO2: 29 mEq/L (ref 19–32)
Calcium: 8.9 mg/dL (ref 8.4–10.5)
Creatinine, Ser: 0.55 mg/dL (ref 0.50–1.10)

## 2011-12-12 NOTE — Progress Notes (Signed)
UR of chart completed. Not anticipating any HH needs for patient when she is ready for d/c.

## 2011-12-12 NOTE — Progress Notes (Signed)
Patient ID: Stacie Clayton, female   DOB: Jan 02, 1961, 50 y.o.   MRN: [id number] 4 Days Post-Op  Subjective: Feels well. Just sore. Tolerating liquids without difficulty. No bowel movement or gas yet but no bloating or nausea. She is anxious to go home for Christmas.  Objective: Vital signs in last 24 hours: Temp:  [97.4 F (36.3 C)-98.5 F (36.9 C)] 97.5 F (36.4 C) (12/24 0701) Pulse Rate:  [67-87] 69  (12/24 0701) Resp:  [18-97] 18  (12/24 0701) BP: (99-117)/(60-70) 99/61 mmHg (12/24 0701) SpO2:  [96 %-100 %] 96 % (12/24 0701) Last BM Date: 12/08/11  Intake/Output from previous day: 12/23 0701 - 12/24 0700 In: 2940 [P.O.:840; I.V.:2100] Out: 1956 [Urine:950; Drains:105; Stool:901] Intake/Output this shift:    General appearance: alert and no distress GI: normal findings: soft, non-tender Incision/Wound:wound is clean and dry. Jackson-Pratt drains with dilute bloody drainage.  Lab Results:   [facility] 12/12/11 0642 12/11/11 0605  WBC 4.0 6.7  HGB 7.4* 8.1*  HCT 22.4* 24.2*  PLT 225 239   BMET  Basename 12/12/11 0642 12/10/11 0705  NA 138 138  K 3.9 4.4  CL 105 104  CO2 29 30  GLUCOSE 104* 131*  BUN 6 9  CREATININE 0.55 0.61  CALCIUM 8.9 8.9   PT/INR No results found for this basename: LABPROT:2,INR:2 in the last 72 hours ABG No results found for this basename: PHART:2,PCO2:2,PO2:2,HCO3:2 in the last 72 hours  Studies/Results: No results found.  Anti-infectives: Anti-infectives     Start     Dose/Rate Route Frequency Ordered Stop   12/08/11 1830   ceFAZolin (ANCEF) IVPB 1 g/50 mL premix        1 g 100 mL/hr over 30 Minutes Intravenous Every 6 hours 12/08/11 1746 12/09/11 0652   12/08/11 1405   polymyxin B 500,000 Units, bacitracin 50,000 Units in sodium chloride irrigation 0.9 % 500 mL irrigation  Status:  Discontinued          As needed 12/08/11 1407 12/08/11 1605   12/07/11 1515   ceFAZolin (ANCEF) IVPB 1 g/50 mL premix        1 g 100 mL/hr over  30 Minutes Intravenous 60 min pre-op 12/07/11 1510 12/08/11 1303          Assessment/Plan: s/p Procedure(s): HERNIA REPAIR VENTRAL ADULT LYSIS OF ADHESION Generally doing well. Decreased hemoglobin/blood loss anemia. There's been a significant drop from yesterday and I think this needs to be observed in the hospital for now. Will advance diet. Repeat hemoglobin in a.m.   LOS: 4 days    Jonelle Bann T 12/12/2011

## 2011-12-13 LAB — CBC
MCH: 30.3 pg (ref 26.0–34.0)
MCV: 89.6 fL (ref 78.0–100.0)
Platelets: 251 10*3/uL (ref 150–400)
RDW: 13.7 % (ref 11.5–15.5)
WBC: 3.8 10*3/uL — ABNORMAL LOW (ref 4.0–10.5)

## 2011-12-13 MED ORDER — HYDROCODONE-ACETAMINOPHEN 5-325 MG PO TABS: 1.0000 | ORAL_TABLET | ORAL | Status: DC | PRN

## 2011-12-13 NOTE — Progress Notes (Signed)
5 Days Post-Op  Subjective: Feels better.  No weakness or SOB.  Ambulating without difficulty.  Objective: Vital signs in last 24 hours: Temp:  [97 F (36.1 C)-98.1 F (36.7 C)] 97.5 F (36.4 C) (12/25 0536) Pulse Rate:  [58-89] 60  (12/25 0536) Resp:  [16-19] 16  (12/25 0848) BP: (100-131)/(60-76) 100/60 mmHg (12/25 0536) SpO2:  [96 %-100 %] 99 % (12/25 0848) FiO2 (%):  [98 %] 98 % (12/24 1234) Last BM Date: 12/08/11  Intake/Output from previous day: 12/24 0701 - 12/25 0700 In: 655 [I.V.:590] Out: 1350 [Urine:1325; Drains:25] Intake/Output this shift:    General appearance: alert, cooperative and no distress GI: soft, non-tender; bowel sounds normal; no masses,  no organomegaly Incision well-healed with no sign of infection.  Drains with some old bloody drainage - too much to remove yet.  Lab Results:   Basename 12/13/11 0500 12/12/11 0642  WBC 3.8* 4.0  HGB 7.3* 7.4*  HCT 21.6* 22.4*  PLT 251 225   BMET  Basename 12/12/11 0642  NA 138  K 3.9  CL 105  CO2 29  GLUCOSE 104*  BUN 6  CREATININE 0.55  CALCIUM 8.9   PT/INR No results found for this basename: LABPROT:2,INR:2 in the last 72 hours ABG No results found for this basename: PHART:2,PCO2:2,PO2:2,HCO3:2 in the last 72 hours  Studies/Results: No results found.  Anti-infectives: Anti-infectives     Start     Dose/Rate Route Frequency Ordered Stop   12/08/11 1830   ceFAZolin (ANCEF) IVPB 1 g/50 mL premix        1 g 100 mL/hr over 30 Minutes Intravenous Every 6 hours 12/08/11 1746 12/09/11 0652   12/08/11 1405   polymyxin B 500,000 Units, bacitracin 50,000 Units in sodium chloride irrigation 0.9 % 500 mL irrigation  Status:  Discontinued          As needed 12/08/11 1407 12/08/11 1605   12/07/11 1515   ceFAZolin (ANCEF) IVPB 1 g/50 mL premix        1 g 100 mL/hr over 30 Minutes Intravenous 60 min pre-op 12/07/11 1510 12/08/11 1303          Assessment/Plan: s/p Procedure(s): HERNIA REPAIR  VENTRAL ADULT LYSIS OF ADHESION Discharge Will follow-up in office later this week for drain removal.  LOS: 5 days    Taisa Deloria K. 12/13/2011

## 2011-12-13 NOTE — Discharge Summary (Signed)
Physician Discharge Summary  Patient ID: Stacie Clayton MRN: [id number] DOB/AGE: July 03, 1961 50 y.o.  Admit date: 12/08/2011 Discharge date: 12/13/2011  Admission Diagnoses:ventral hernia   Discharge Diagnoses: ventral hernia' Acute blood loss anemia Active Problems:  Abdominal hernia   Discharged Condition: good  Hospital Course: Ventral hernia/ rectus diastasis repair by Dr. Donell Beers [place]  Consults: none  Significant Diagnostic Studies: none  Treatments: surgery: ventral hernia/ rectus diastasis repair  Discharge Exam: Blood pressure 100/60, pulse 60, temperature 97.5 F (36.4 C), temperature source Oral, resp. rate 16, height 5\' 1"  (1.549 m), weight 133 lb 6.1 oz (60.5 kg), SpO2 99.00%. Abdomen - soft, non-tender; incision well-healed; drains with old bloody output  Disposition: Home or Self Care  Discharge Orders    Future Orders Please Complete By Expires   Diet general      Discharge instructions      Comments:   Dr. Arita Miss nurse will call you tomorrow to schedule an appointment to have your drains removed in the office.   No heavy lifting or strenuous activity. Wear your abdominal binder when out of bed. Empty and record drain output.  Bring this record with you to your appointment. Sponge baths only while drains are in place.   Increase activity slowly      May walk up steps      Driving Restrictions      Comments:   Do not drive while taking pain medications   Call MD for:  temperature >100.4      Call MD for:  persistant nausea and vomiting      Call MD for:  severe uncontrolled pain      Call MD for:  redness, tenderness, or signs of infection (pain, swelling, redness, odor or green/yellow discharge around incision site)      Change dressing (specify)      Comments:   Dressing change: drain sponges around the two JP drains - change as needed.   Nursing communication      Comments:   Instruct patient on drain care - empty and record drain output  daily.     Medication List  As of 12/13/2011  9:42 AM   START taking these medications         HYDROcodone-acetaminophen 5-325 MG per tablet   Commonly known as: NORCO   Take 1-2 tablets by mouth every 4 (four) hours as needed.         CONTINUE taking these medications         acetaminophen 325 MG tablet   Commonly known as: TYLENOL      calcium carbonate 1250 MG tablet   Commonly known as: OS-CAL - dosed in mg of elemental calcium      multivitamin-prenatal 27-0.8 MG Tabs      multivitamins ther. w/minerals Tabs      pantoprazole 20 MG tablet   Commonly known as: PROTONIX          Where to get your medications    These are the prescriptions that you need to pick up.   You may get these medications from any pharmacy.         HYDROcodone-acetaminophen 5-325 MG per tablet           Follow-up Information    Follow up with [facility], MD in 3 days. (Dr. Arita Miss nurse will call you to schedule the appointment)    Contact information:   [facility] [address] [place]  [address] [phone]       Follow up with SANGER,CLAIRE, DO. Make an appointment in 2 weeks.   Contact information:   [address] [place] [address] [phone]          Signed: Wynona Luna. 12/13/2011, 9:42 AM

## 2011-12-13 NOTE — Progress Notes (Signed)
Iv dc'd, prescription given . Home care instructions gone over, including incisional care, drain care, activity and diet. Follow up appointments to be made. S/s of infection gone over. Home meds gone over. Patient verbalized understanding of all.

## 2011-12-15 ENCOUNTER — Encounter (INDEPENDENT_AMBULATORY_CARE_PROVIDER_SITE_OTHER): Payer: Self-pay | Admitting: Surgery

## 2011-12-15 ENCOUNTER — Ambulatory Visit (INDEPENDENT_AMBULATORY_CARE_PROVIDER_SITE_OTHER): Payer: BC Managed Care – PPO | Admitting: Surgery

## 2011-12-15 VITALS — BP 122/74 | HR 80 | Temp 99.2°F | Resp 16 | Ht 61.0 in | Wt 135.6 lb

## 2011-12-15 DIAGNOSIS — K432 Incisional hernia without obstruction or gangrene: Secondary | ICD-10-CM

## 2011-12-15 MED ORDER — HYDROCODONE-ACETAMINOPHEN 5-325 MG PO TABS: 1.0000 | ORAL_TABLET | ORAL | Status: AC | PRN

## 2011-12-15 NOTE — Patient Instructions (Signed)

## 2011-12-15 NOTE — Progress Notes (Signed)
Subjective:     Patient ID: Stacie Clayton, female   DOB: 10/09/1961, 50 y.o.   MRN: [id number]  HPI  OMAYA NIELAND  Sep 20, 1961 [id number]  Patient Care Team: Meriel Pica, MD as PCP - General (Obstetrics and Gynecology) Almond Lint, MD as Consulting Physician (General Surgery) Wayland Denis, DO as Consulting Physician (Plastic Surgery)  This patient is a 50 y.o.female who presents today for surgical evaluation.   Procedure: Lysis of adhesions and ventral repair and repair diastasis recti 12/08/2011  Patient comes in today for postoperative followup. She was told to come in to clinic today so she was fit into urgent clinic. She was also told that Dr. Kelly Splinter wants to be the one to remove the drains next week.  The patient is constipated. She's not had a bowel movement in a week. No fevers or chills. No nausea or vomiting. Appetite down. Currently get some supplemental shakes N. Still filling rather sore. Using pain medications. Having flatus. She comes today with her husband.  Patient Active Problem List  Diagnoses  . Incisional hernia with distasis s/p repair Dec 2012    Past Medical History  Diagnosis Date  . Weight loss   . Hyperlipidemia     normal now but has had in the past...per pt  . Abdominal wall bulge     epigastric bluge that is more noticable after eating and drinking and is  painful to the touch   . Abdominal pain, right lateral     right flank pain that goes around to back   . Incisional hernia   . Cancer     melanoma on back  . Duodenal perforation   . Complication of anesthesia   . PONV (postoperative nausea and vomiting)   . Blood transfusion 06/2011-4 UNITS rbc'S, 2 uNITS FROZEN PLASMA  . Anemia   . Incisional hernia with distasis s/p repair Dec 2012 09/27/2011    Past Surgical History  Procedure Date  . Tubal reversal 1997  . Melanoma excision     back  . Cholecystectomy   . Tubal ligation 1986  . Ercp w/ sphicterotomy 06/14/11  .  Repair of duodenal perforation 06/19/2011    Perforation from ERCP  . Ventral hernia repair 12/08/11    open w/lysis of adhesions  . Tonsillectomy     "as a child"  . Appendectomy     "as a child"  . Ventral hernia repair 12/08/2011    Procedure: HERNIA REPAIR VENTRAL ADULT;  Surgeon: Almond Lint, MD;  Location: [facility] OR;  Service: General;  Laterality: N/A;  OPEN VENTRAL HERNIA REPAIR WITH LYSIS OF ADHESIONS    History   Social History  . Marital Status: Married    Spouse Name: N/A    Number of Children: N/A  . Years of Education: N/A   Occupational History  . Not on file.   Social History Main Topics  . Smoking status: Former Smoker -- 0.5 packs/day for 10 years    Types: Cigarettes    Quit date: 07/13/1986  . Smokeless tobacco: Never Used  . Alcohol Use: No     last drink "1980"  . Drug Use: No     "smoked a little pot in high school"  . Sexually Active: Yes   Other Topics Concern  . Not on file   Social History Narrative  . No narrative on file    Family History  Problem Relation Age of Onset  . Cancer Mother  lung  . Heart disease Father     heart attack    Current outpatient prescriptions:acetaminophen (TYLENOL) 325 MG tablet, Take 650 mg by mouth every 6 (six) hours as needed. For pain/fever , Disp: , Rfl: ;  calcium carbonate (OS-CAL - DOSED IN MG OF ELEMENTAL CALCIUM) 1250 MG tablet, Take 1 tablet by mouth daily.  , Disp: , Rfl: ;  HYDROcodone-acetaminophen (NORCO) 5-325 MG per tablet, Take 1-2 tablets by mouth every 4 (four) hours as needed for pain., Disp: 50 tablet, Rfl: 0 Multiple Vitamins-Minerals (MULTIVITAMINS THER. W/MINERALS) TABS, Take 1 tablet by mouth daily.  , Disp: , Rfl: ;  pantoprazole (PROTONIX) 20 MG tablet, Take 20 mg by mouth 2 (two) times daily.  , Disp: , Rfl: ;  Prenatal Vit-Fe Fumarate-FA (MULTIVITAMIN-PRENATAL) 27-0.8 MG TABS, Take 1 tablet by mouth daily.  , Disp: , Rfl:   No Known Allergies  BP 122/74  Pulse 80  Temp(Src) 99.2  F (37.3 C) (Temporal)  Resp 16  Ht 5\' 1"  (1.549 m)  Wt 135 lb 9.6 oz (61.508 kg)  BMI 25.62 kg/m2     Review of Systems  Constitutional: Negative for fever, chills and diaphoresis.  HENT: Negative for ear pain, sore throat and trouble swallowing.   Eyes: Negative for photophobia and visual disturbance.  Respiratory: Negative for cough and choking.   Cardiovascular: Negative for chest pain and palpitations.  Gastrointestinal: Positive for abdominal pain and constipation. Negative for nausea, vomiting, diarrhea, blood in stool, anal bleeding and rectal pain.  Genitourinary: Negative for dysuria, frequency and difficulty urinating.  Musculoskeletal: Negative for myalgias and gait problem.  Skin: Negative for color change, pallor and rash.  Neurological: Negative for dizziness, speech difficulty, weakness and numbness.  Hematological: Negative for adenopathy.  Psychiatric/Behavioral: Negative for confusion and agitation. The patient is not nervous/anxious.        Objective:   Physical Exam  Constitutional: She is oriented to person, place, and time. She appears well-developed and well-nourished.  Non-toxic appearance. She does not have a sickly appearance. She does not appear ill. No distress.       Moving slowly and stiffly  HENT:  Head: Normocephalic.  Mouth/Throat: Oropharynx is clear and moist. No oropharyngeal exudate.  Eyes: Conjunctivae and EOM are normal. Pupils are equal, round, and reactive to light. No scleral icterus.  Neck: Normal range of motion. No tracheal deviation present.  Cardiovascular: Normal rate and intact distal pulses.   Pulmonary/Chest: Effort normal. No respiratory distress. She exhibits no tenderness.  Abdominal: Soft. She exhibits no distension. Hernia confirmed negative in the right inguinal area and confirmed negative in the left inguinal area.       Incisions clean with normal healing ridges.  No hernias.  Scant serosanguineous fluid in bilateral  closed bulb drains  Genitourinary: No vaginal discharge found.  Musculoskeletal: Normal range of motion. She exhibits no tenderness.  Lymphadenopathy:       Right: No inguinal adenopathy present.       Left: No inguinal adenopathy present.  Neurological: She is alert and oriented to person, place, and time. No cranial nerve deficit. She exhibits normal muscle tone. Coordination normal.  Skin: Skin is warm and dry. No rash noted. She is not diaphoretic.  Psychiatric: She has a normal mood and affect. Her behavior is normal.       Assessment:     POD # 7 s/p VWH repair with drains, recovering OK    Plan:     Bowel regimen  and to avoid constipation. Get on MiraLax.  Used laxatives more aggressively  Increase physical activity. Walk more regularly.  Ensure getting adequate protein intake. Okay to advance to a solid diet.  Followup next week with Dr. Kelly Splinter for drain removal.  Followup Dr. Donell Beers in a few weeks for followup  Renew hydrocodone

## 2012-01-03 ENCOUNTER — Encounter (INDEPENDENT_AMBULATORY_CARE_PROVIDER_SITE_OTHER): Payer: Self-pay | Admitting: General Surgery

## 2012-01-03 ENCOUNTER — Ambulatory Visit (INDEPENDENT_AMBULATORY_CARE_PROVIDER_SITE_OTHER): Payer: BC Managed Care – PPO | Admitting: General Surgery

## 2012-01-03 VITALS — BP 126/84 | HR 64 | Temp 98.7°F | Resp 16 | Ht 61.0 in | Wt 129.0 lb

## 2012-01-03 DIAGNOSIS — K432 Incisional hernia without obstruction or gangrene: Secondary | ICD-10-CM

## 2012-01-03 MED ORDER — HYDROCODONE-ACETAMINOPHEN 5-325 MG PO TABS: 1.0000 | ORAL_TABLET | Freq: Four times a day (QID) | ORAL | Status: AC | PRN

## 2012-01-03 NOTE — Patient Instructions (Signed)
Continue taking it easy with no lifting.  Follow up with Dr. Kelly Splinter for drain removal  Wean pain meds as tolerated.  Avoid constipation.  Follow up in 4-6 weeks.

## 2012-01-03 NOTE — Assessment & Plan Note (Signed)
Doing well. Dr. Kelly Splinter to see about drain removal. Follow up with me in 4 weeks. Refill vicodin. Continue stringent lifting restrictions. OK to walk.

## 2012-01-03 NOTE — Progress Notes (Signed)
Subjective:     Patient ID: Stacie Clayton, female   DOB: [birth year], 51 y.o.   MRN: [id number]  HPI Doing well post op from ventral hernia repair.  Energy level improving.  No fevers/chills.  Down to around 3 pain pills/day.  Dr. Kelly Splinter removed one of the drains.  Feels "tight."  Review of Systems Otherwise negative    Objective:   Physical Exam Incision healing well.  Drain with scant amount of drainage.  No sign of recurrent hernia.      Assessment/Plan:     Incisional hernia with distasis s/p repair Dec 2012 Doing well. Dr. Kelly Splinter to see about drain removal. Follow up with me in 4 weeks. Refill vicodin. Continue stringent lifting restrictions. OK to walk.

## 2012-01-06 ENCOUNTER — Encounter (INDEPENDENT_AMBULATORY_CARE_PROVIDER_SITE_OTHER): Payer: BC Managed Care – PPO | Admitting: General Surgery

## 2012-02-14 ENCOUNTER — Encounter (INDEPENDENT_AMBULATORY_CARE_PROVIDER_SITE_OTHER): Payer: Self-pay | Admitting: General Surgery

## 2012-02-14 ENCOUNTER — Ambulatory Visit (INDEPENDENT_AMBULATORY_CARE_PROVIDER_SITE_OTHER): Payer: BC Managed Care – PPO | Admitting: General Surgery

## 2012-02-14 VITALS — BP 118/80 | HR 68 | Temp 98.2°F | Resp 16 | Ht 61.0 in | Wt 131.8 lb

## 2012-02-14 DIAGNOSIS — K432 Incisional hernia without obstruction or gangrene: Secondary | ICD-10-CM

## 2012-02-14 NOTE — Assessment & Plan Note (Signed)
Recommended waiting at least 6 months before having other surgery.  I cannot think of other ways to evaluate her early satiety that we have not already done.  She is going to follow up with me on a PRN basis.

## 2012-02-14 NOTE — Progress Notes (Signed)
HISTORY: Pt doing well overall.  She still cannot eat much at one time.  She eats mostly soft foods or smoothies.  She is having some pain at the right costal margin.     EXAM: General:  Alert and oriented times 3. Incision:  Intact.  No sign of hernia.     ASSESSMENT AND PLAN:   Incisional hernia with distasis s/p repair Dec 2012 Recommended waiting at least 6 months before having other surgery.  I cannot think of other ways to evaluate her early satiety that we have not already done.  She is going to follow up with me on a PRN basis.         Maudry Diego, MD Surgical Oncology, General & Endocrine Surgery [facility]  Meriel Pica, MD, MD Meriel Pica, MD

## 2012-03-29 IMAGING — CT CT ABCESS DRAINAGE
1 series · 15 of 32 positions shown, 19 images · non-contrast
Comparison: none

CLINICAL DATA: Status post surgical repair of the duodenum after
perforation.  CT has demonstrated residual abscess of the right
retroperitoneum tracking along the ascending colon.

[Series 2: abd abscess drain · axial · 0.56mm/px · z∈[-174,-69]mm · 15 of 48 slices shown, 19 images]
[im 4/48  soft-tissue]
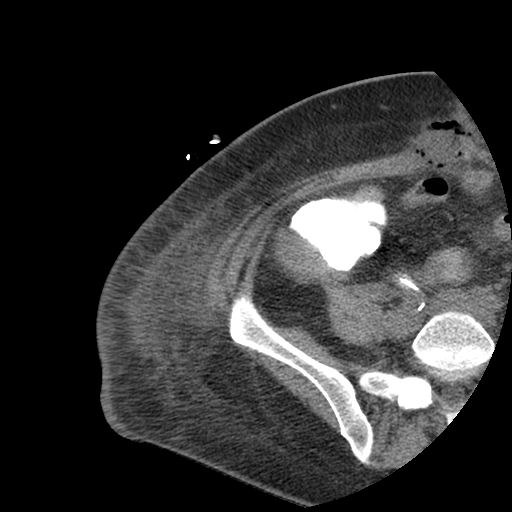
[im 4/48  bone]
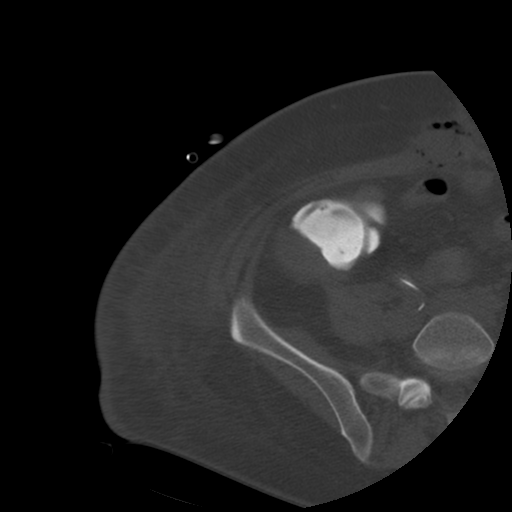
[im 7/48  soft-tissue]
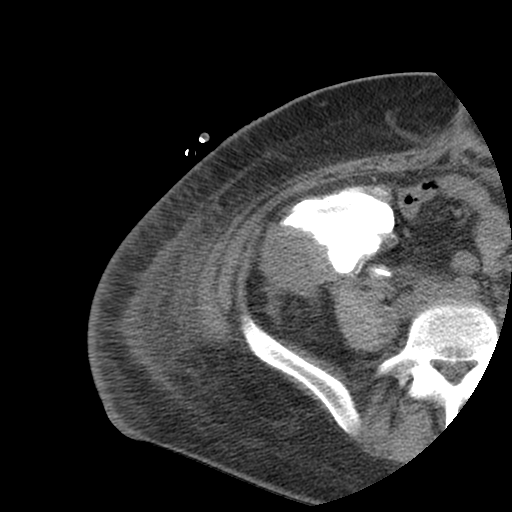
[im 10/48  soft-tissue]
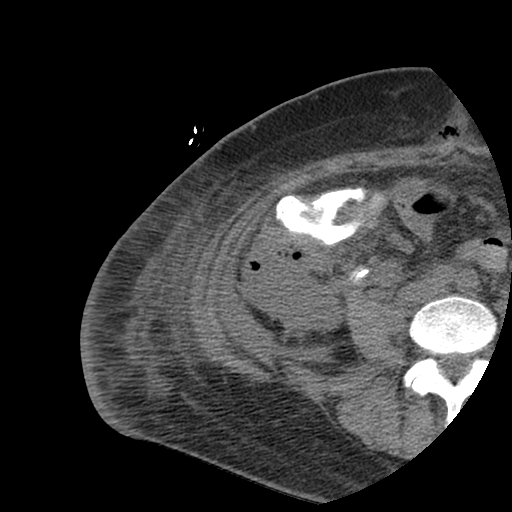
[im 14/48  soft-tissue]
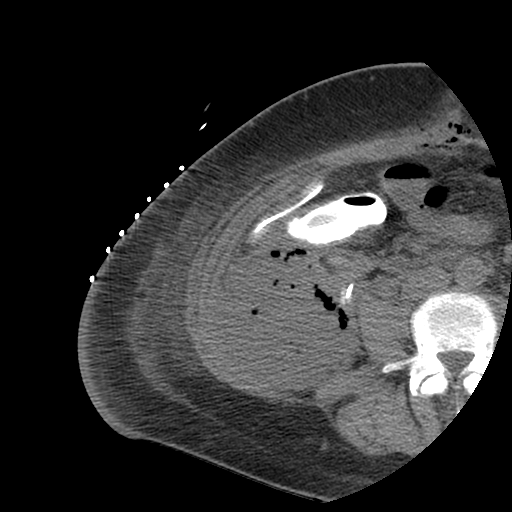
[im 17/48  soft-tissue]
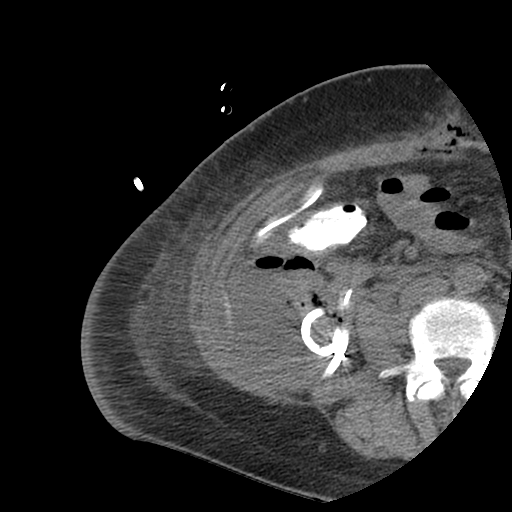
[im 20/48  soft-tissue]
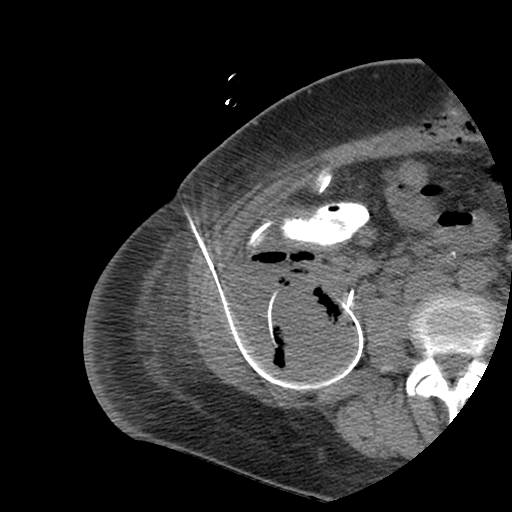
[im 25/48  soft-tissue]
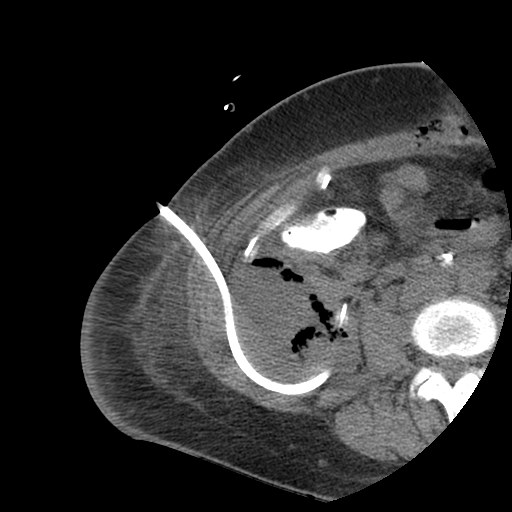
[im 28/48  soft-tissue]
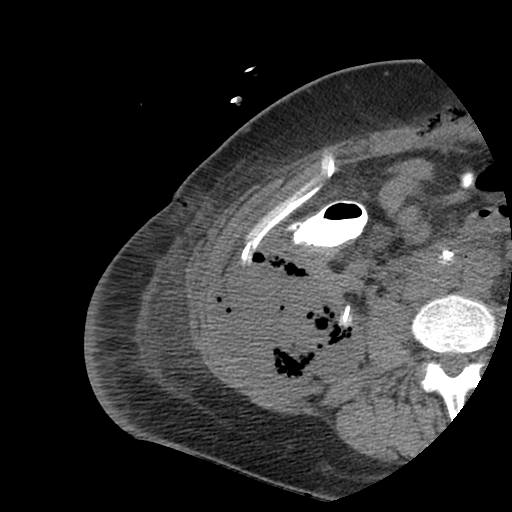
[im 31/48  soft-tissue]
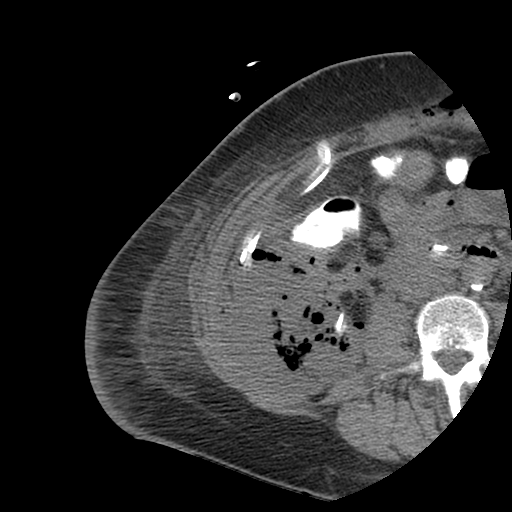
[im 31/48  bone]
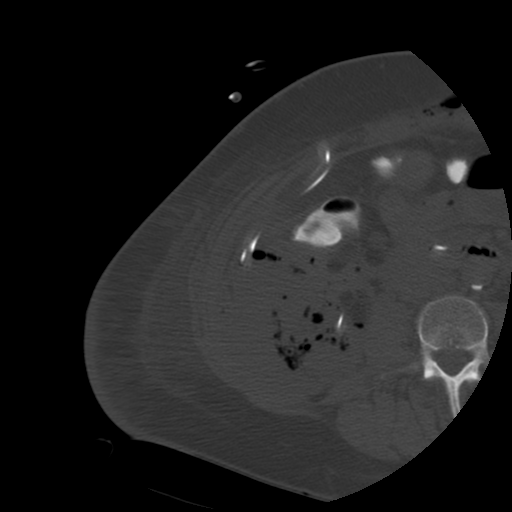
[im 34/48  soft-tissue]
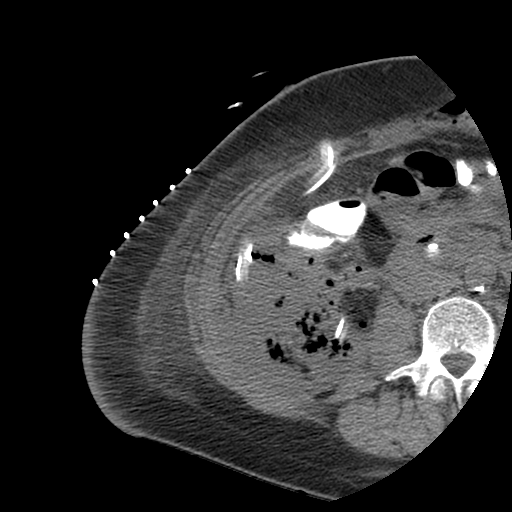
[im 38/48  soft-tissue]
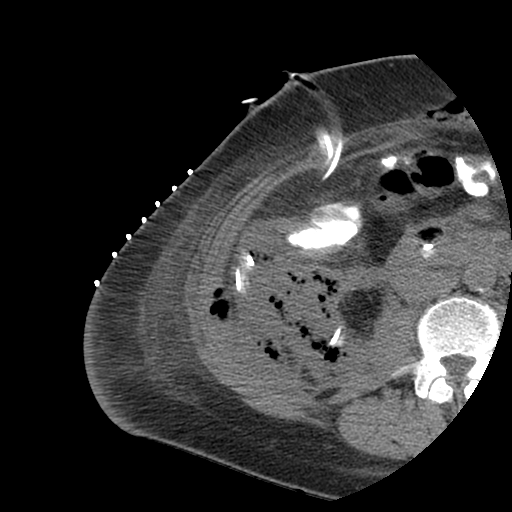
[im 38/48  lung]
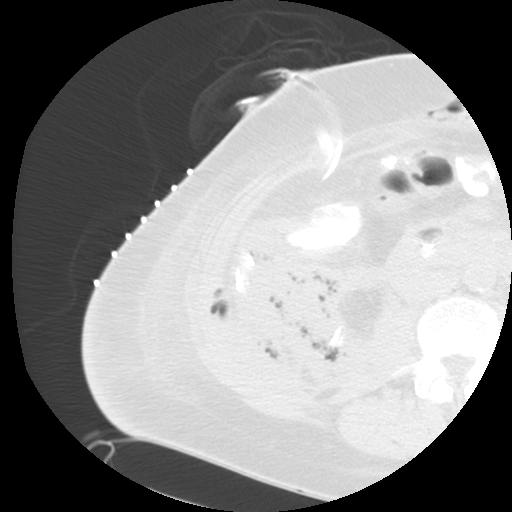
[im 41/48  soft-tissue]
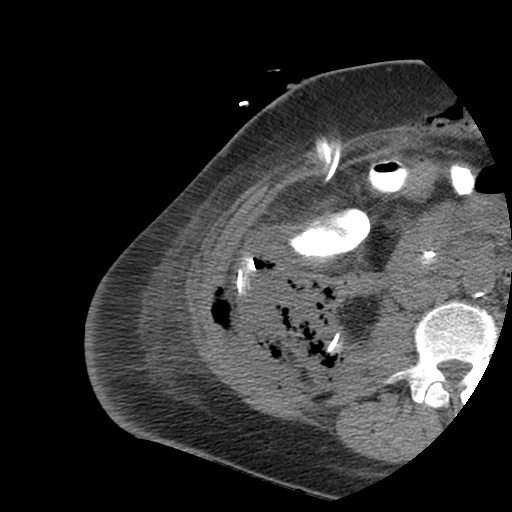
[im 43/48  lung]
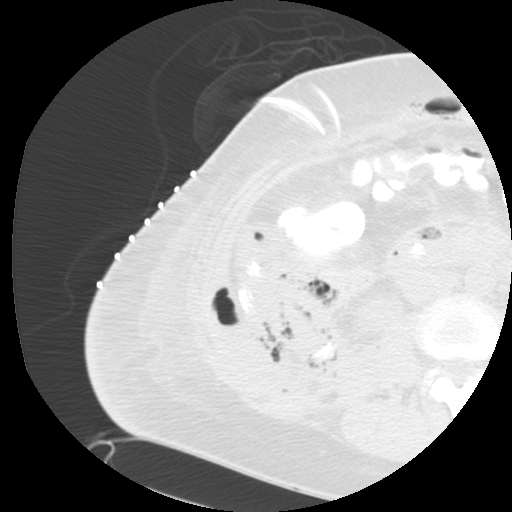
[im 44/48  soft-tissue]
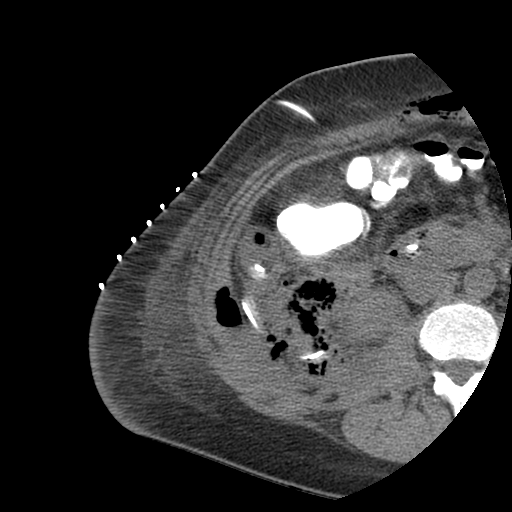
[im 44/48  lung]
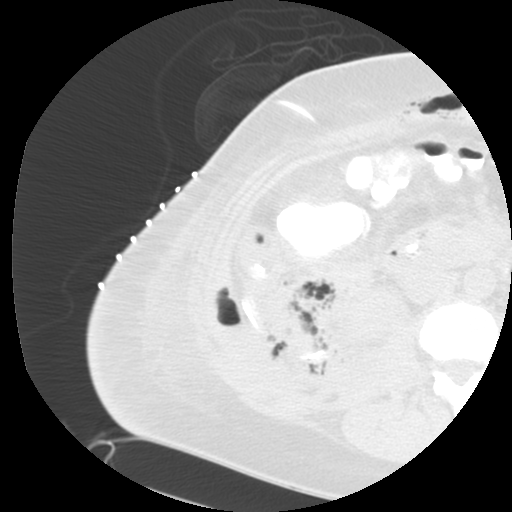
[im 46/48  lung]
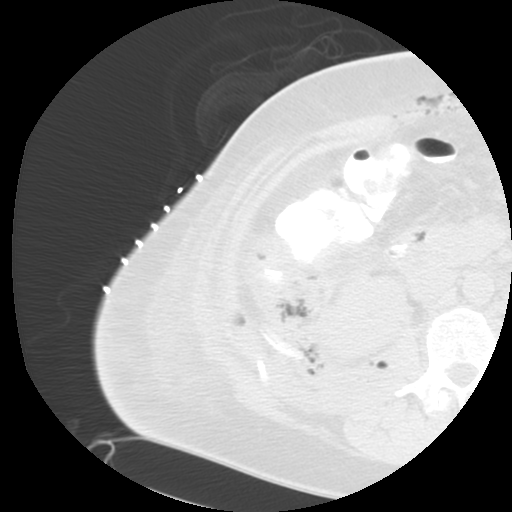

[15 of 32 positions shown; findings below may reference images not displayed]

CT GUIDED DRAINAGE OF RIGHT RETROPERITONEAL ABSCESS

Sedation:  2.0 mg IV Versed;  100 mcg IV Fentanyl

Total Moderate Sedation Time: 20 minutes.

Procedure:  The procedure, risks, benefits, and alternatives were
explained to the patient.  Questions regarding the procedure were
encouraged and answered. The patient understands and consents to
the procedure.

The right lateral abdominal wall was prepped with betadine in a
sterile fashion, and a sterile drape was applied covering the
operative field.  A sterile gown and sterile gloves were used for
the procedure. Local anesthesia was provided with 1% Lidocaine.

CT was used to localize right lateral retroperitoneal abscess.  An
18 gauge trocar needle was advanced under CT guidance into the
collection.  Aspiration was performed.  A sample of fluid was sent
for culture studies.  A guidewire was advanced into the collection.
The tract was dilated and a 12-French all-purpose drainage catheter
placed.  Catheter position was confirmed by CT.  Catheter was
flushed and connected to a suction bulb.  It was secured at the
skin with a Prolene retention suture and adhesive Stat-Lock device.

Complications: None
FINDINGS: Grossly purulent fluid was aspirated from the right
lateral retroperitoneal collection.  After drain placement, there
is rapid drainage present of purulent fluid.  Output will be
followed.
IMPRESSION: CT guided drainage of right lateral retroperitoneal abscess.  A 12-
French drain was placed.  A fluid sample was sent for culture
studies.

## 2012-04-03 IMAGING — CT CT ABCESS DRAINAGE
1 series · 15 of 32 positions shown, 19 images · non-contrast
Comparison: none

CLINICAL HISTORY: History of duodenal perforation and residual
abscess in the right abdomen.

[Series 2: routine abdomen · axial · 0.82mm/px · z∈[-138,-58]mm · 15 of 32 slices shown, 19 images]
[im 3/32  soft-tissue]
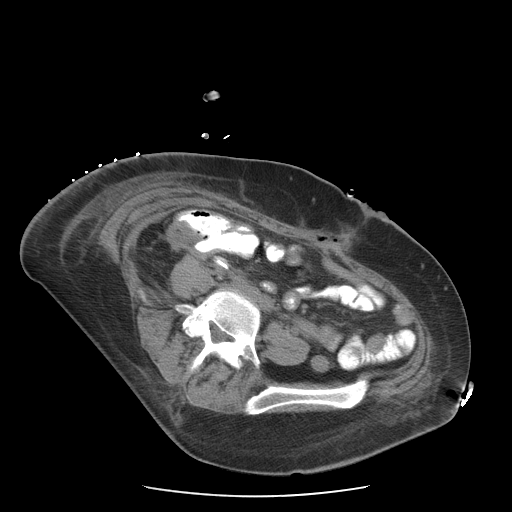
[im 3/32  bone]
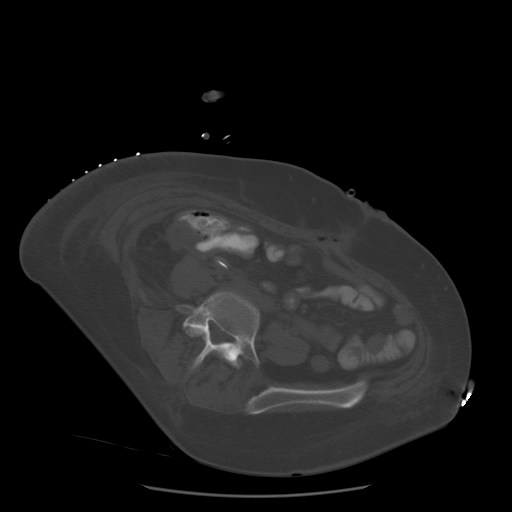
[im 5/32  soft-tissue]
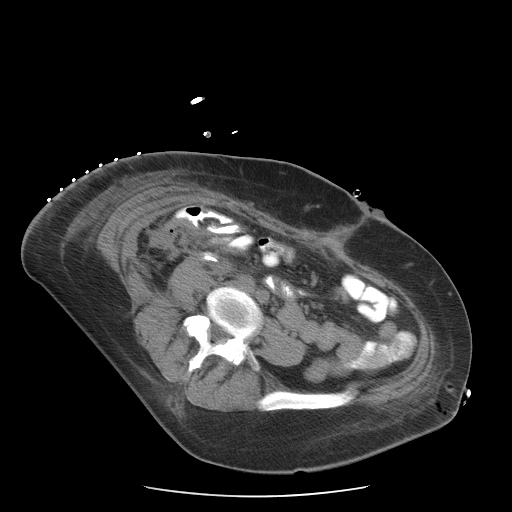
[im 7/32  soft-tissue]
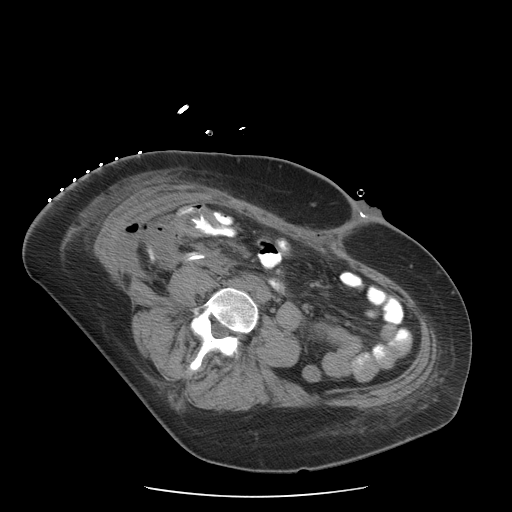
[im 10/32  soft-tissue]
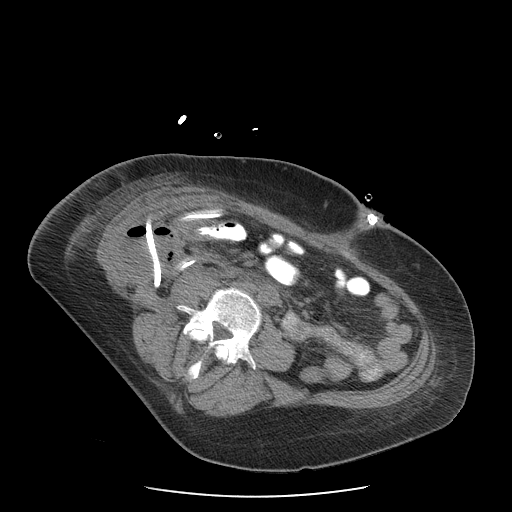
[im 12/32  soft-tissue]
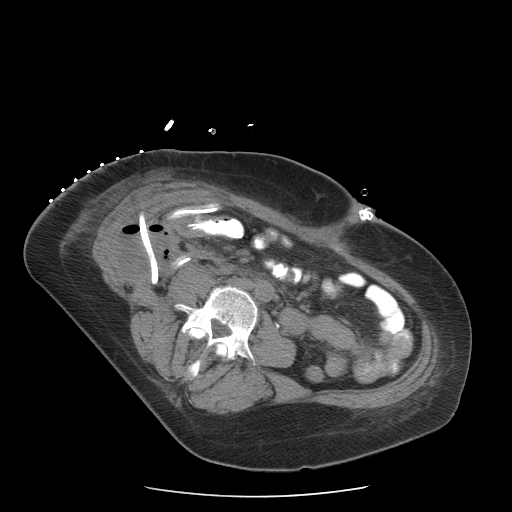
[im 14/32  soft-tissue]
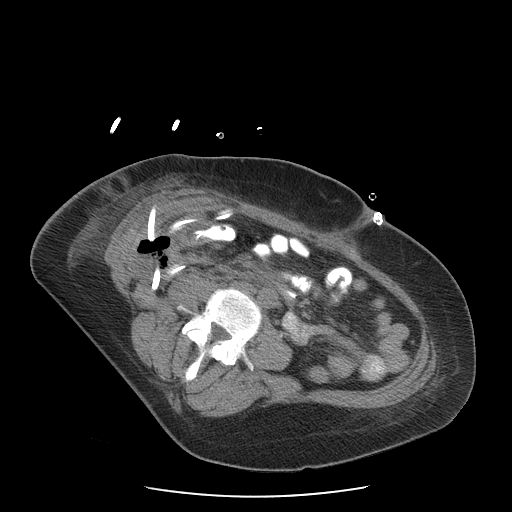
[im 17/32  soft-tissue]
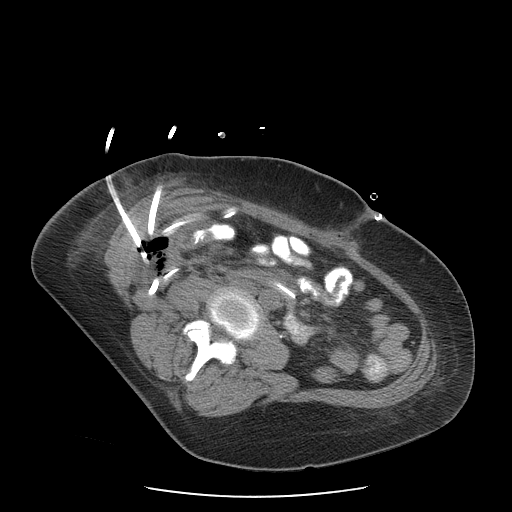
[im 19/32  soft-tissue]
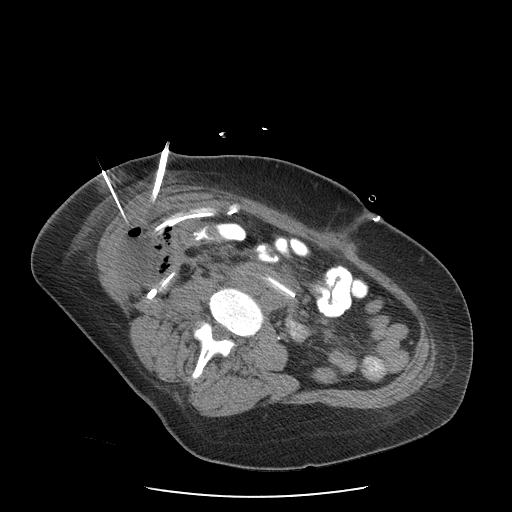
[im 21/32  soft-tissue]
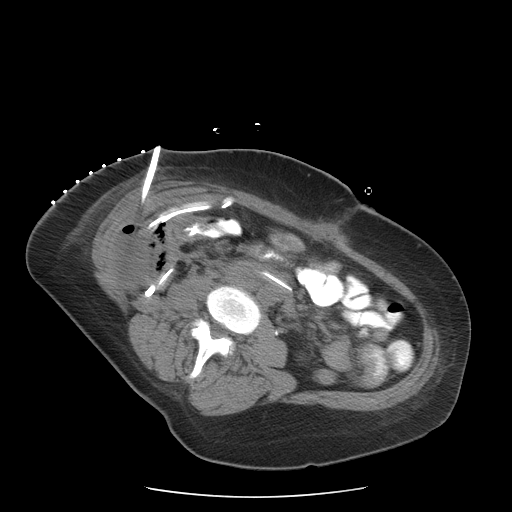
[im 21/32  bone]
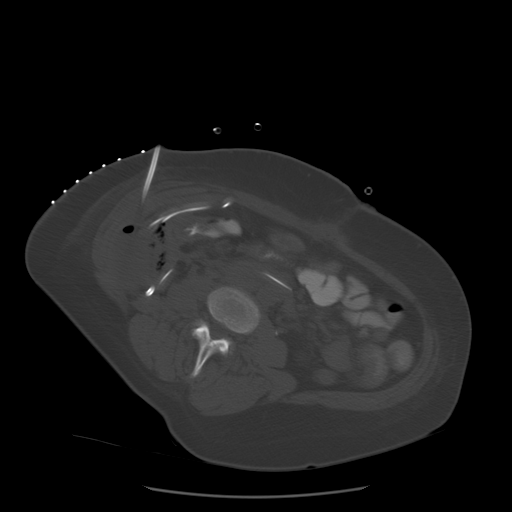
[im 23/32  soft-tissue]
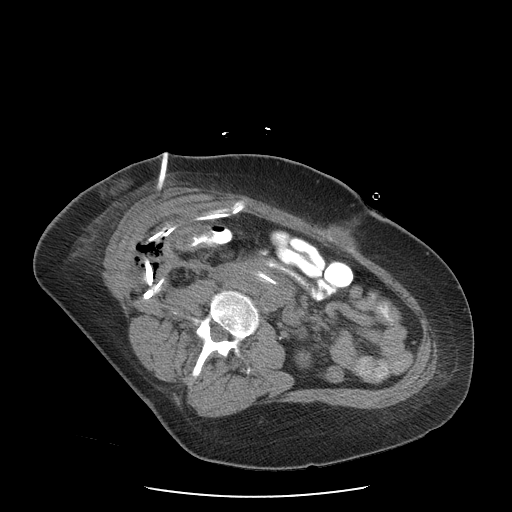
[im 26/32  soft-tissue]
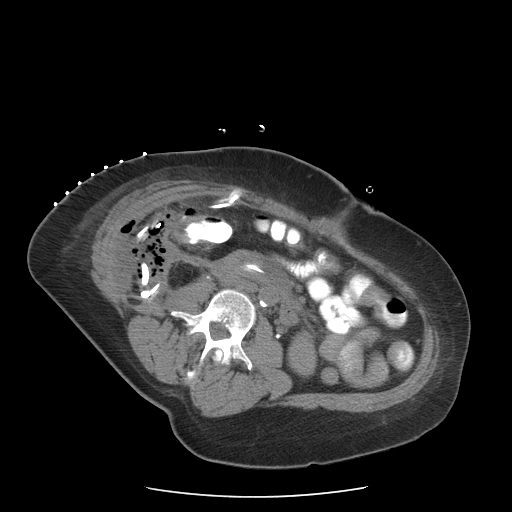
[im 28/32  soft-tissue]
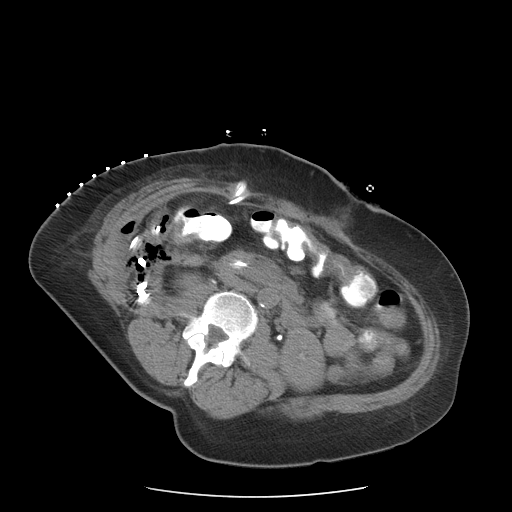
[im 28/32  lung]
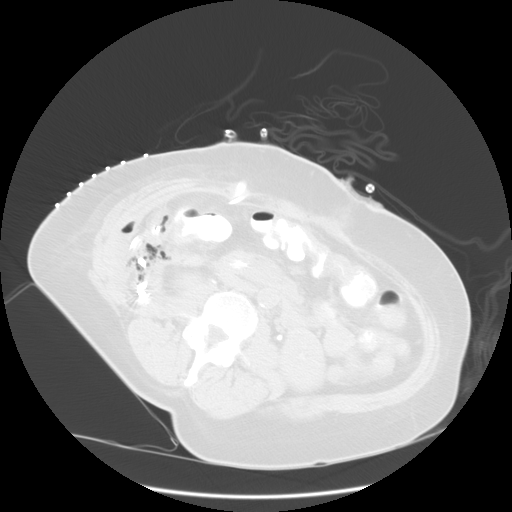
[im 29/32  lung]
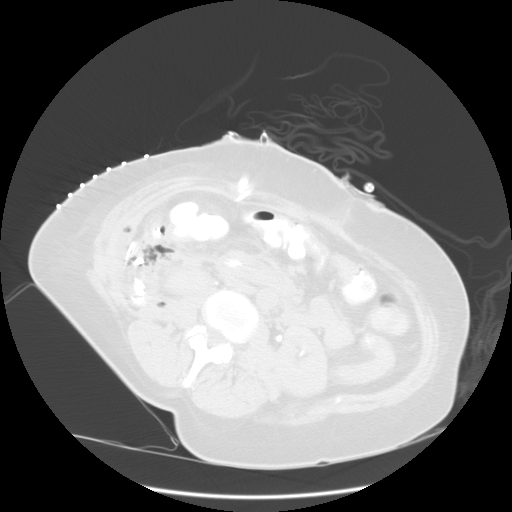
[im 30/32  soft-tissue]
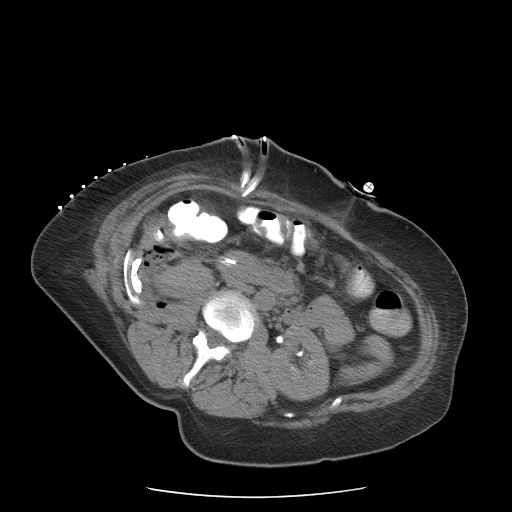
[im 30/32  lung]
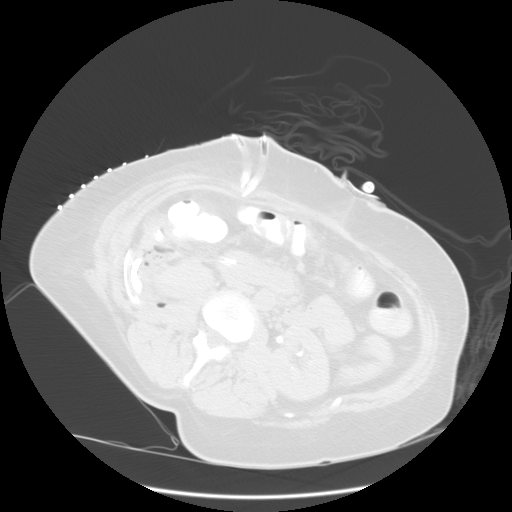
[im 31/32  lung]
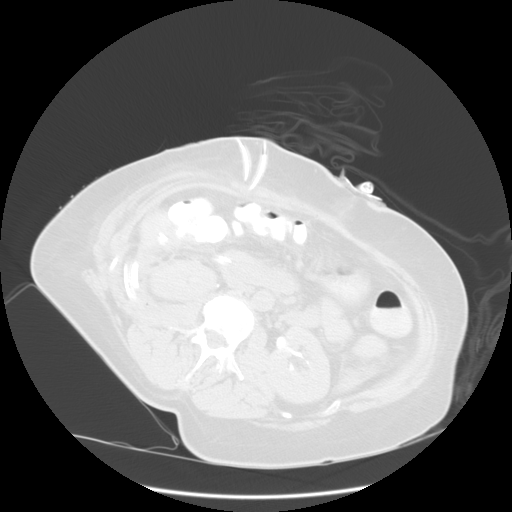

[15 of 32 positions shown; findings below may reference images not displayed]

PROCEDURE(S): CT GUIDED DRAIN PLACEMENT

Medications:None

Moderate sedation time:None

Procedure:The procedure was explained to the patient.  The risks
and benefits of the procedure were discussed and the patient's
questions were addressed.  Informed consent was obtained from the
patient.  The patient was placed supine on the CT scanner and the
right side was slightly elevated.  Images were obtained of the
right abdomen.  The right side of the abdomen was prepped and
draped in a sterile fashion.  The skin was anesthetized with
lidocaine.  18 gauge needle was directed into the posterior fluid
collection and yellow purulent fluid was aspirated.  A stiff
Amplatz wire was placed and a 12-French drain was placed within the
collection.  65 ml of thick yellow purulent fluid was aspirated.
Catheter flushed with normal saline.  Catheter was sutured to the
skin.
FINDINGS: Residual air-fluid collection posterior to the existing
pigtail drain.  The drainage catheter was placed within the
collection.

Complications: None
IMPRESSION: CT guided drain placement in the residual right
abdominal abscess.  65 ml of purulent fluid was obtained.  Sample
sent for culture.

## 2012-04-20 IMAGING — CT CT ABD-PELV W/ CM
3 of 5 series · 12 of 36 positions shown, 18 images · IV contrast (OMNI 300, WATER & [ID] OMNI 300)
Comparison: 07/08/2011

CLINICAL DATA: Status post repair of duodenal perforation and
drainage for right retrocolic abscess.  History of melanoma in
2881.  Constipation.

CT ABDOMEN AND PELVIS WITH CONTRAST
TECHNIQUE: Multidetector CT imaging of the abdomen and pelvis was
performed following the standard protocol during bolus
administration of intravenous contrast.
Contrast: 100  ml 4mnipaque-SGG

[Series 3: routine abdomen · axial · 0.75mm/px · z∈[-324,+26]mm · 8 of 92 slices shown, 13 images]
[im 11/92  soft-tissue]
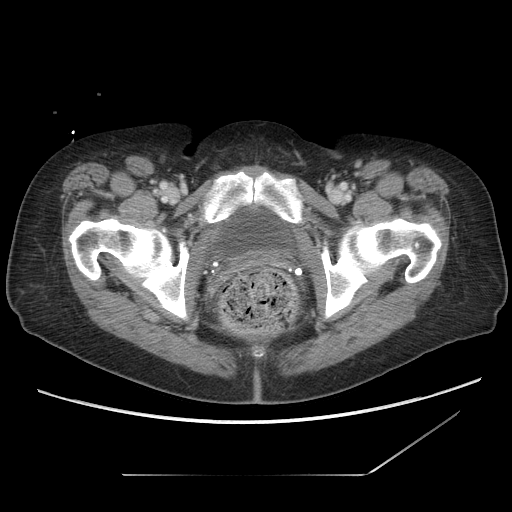
[im 11/92  bone]
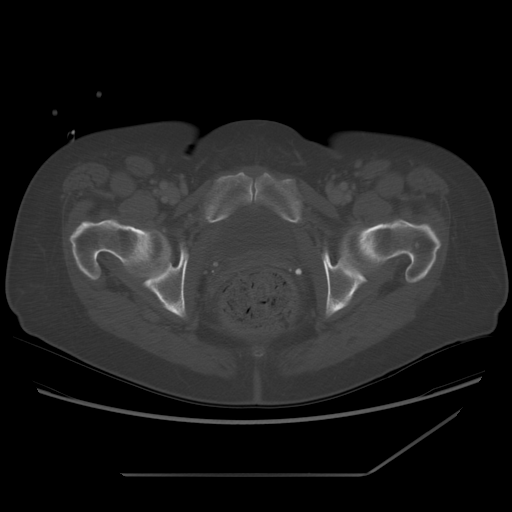
[im 21/92  soft-tissue]
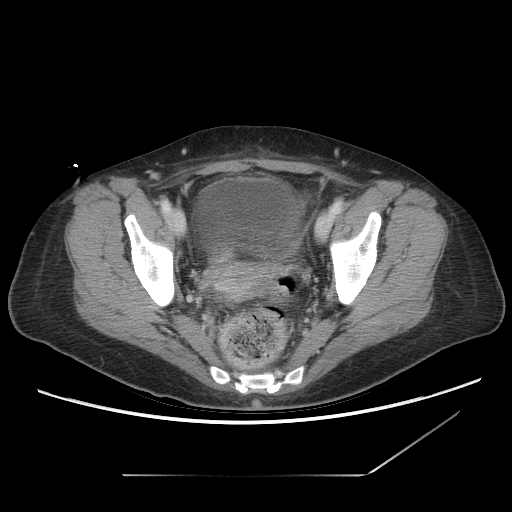
[im 31/92  soft-tissue]
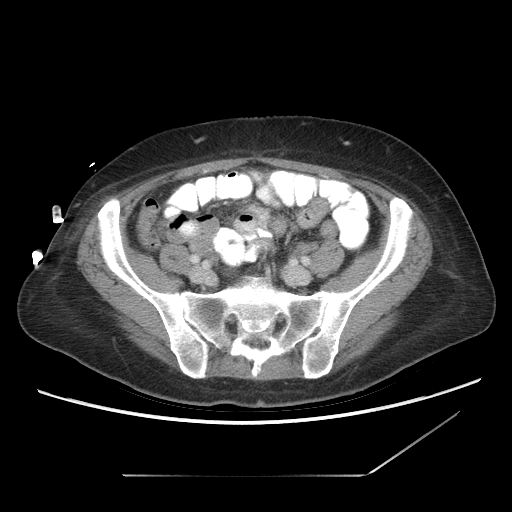
[im 41/92  soft-tissue]
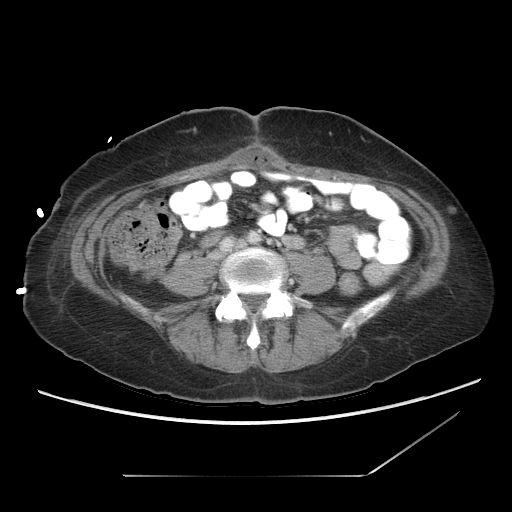
[im 51/92  soft-tissue]
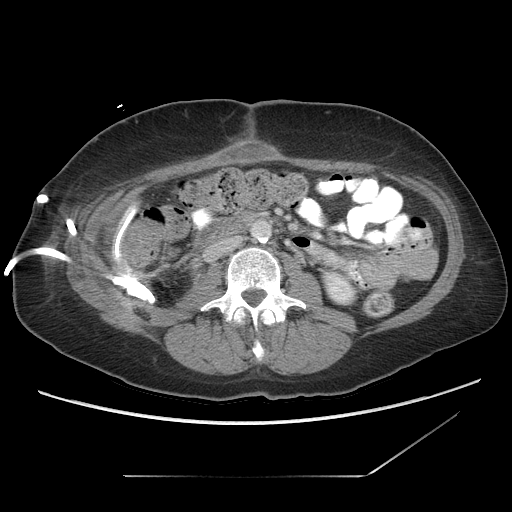
[im 51/92  lung]
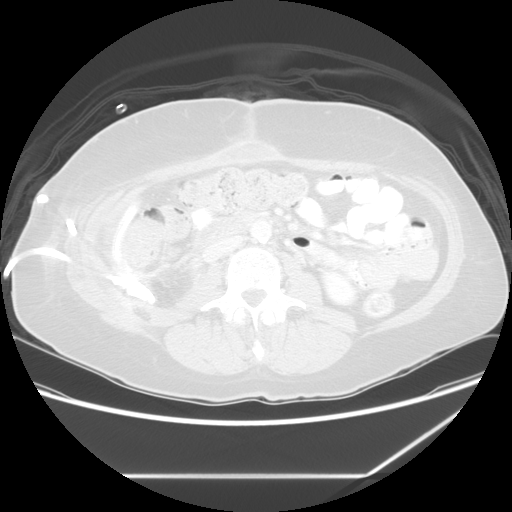
[im 61/92  soft-tissue]
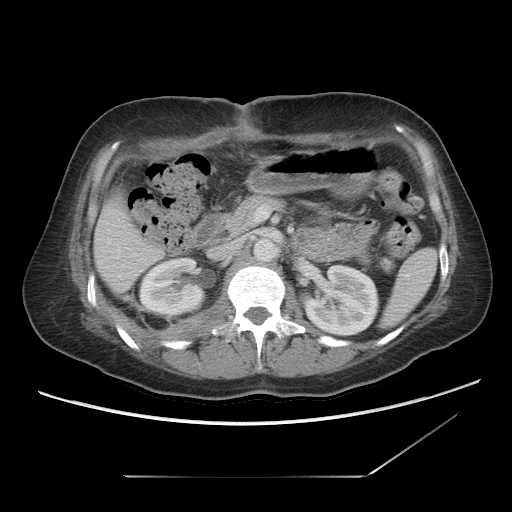
[im 61/92  lung]
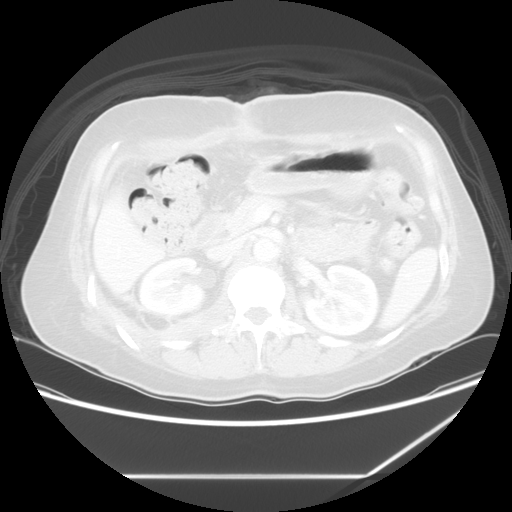
[im 71/92  soft-tissue]
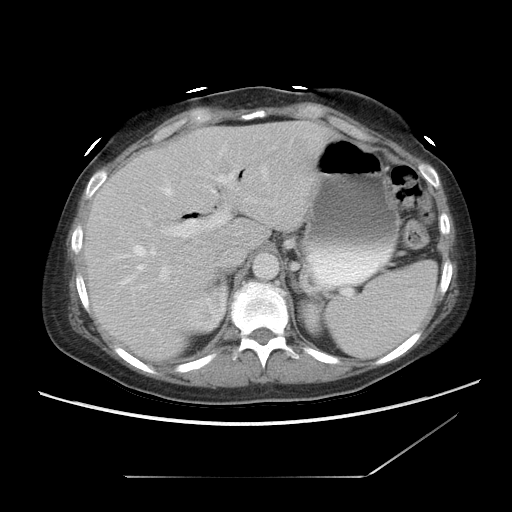
[im 71/92  lung]
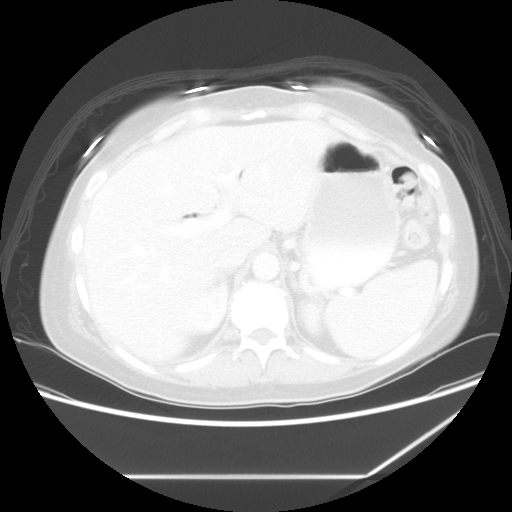
[im 81/92  soft-tissue]
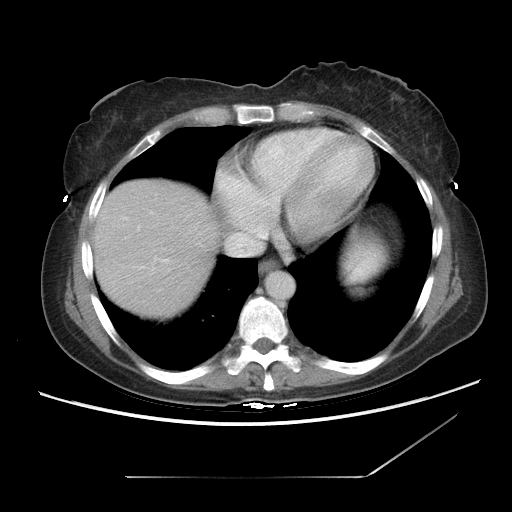
[im 81/92  lung]
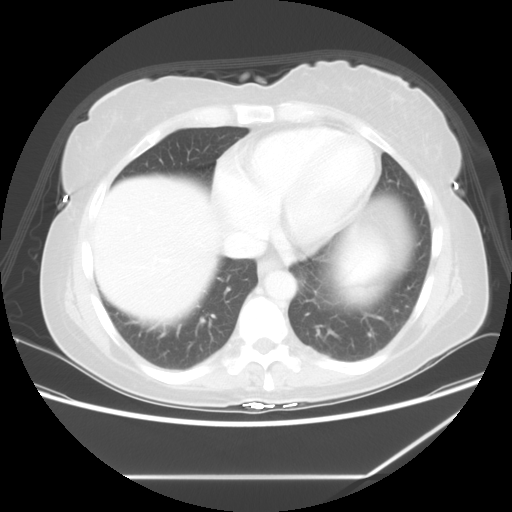

[Series 601: coronal body · coronal · 0.92mm/px · 1 of 107 slices shown, 2 images]
[im 36/107  soft-tissue]
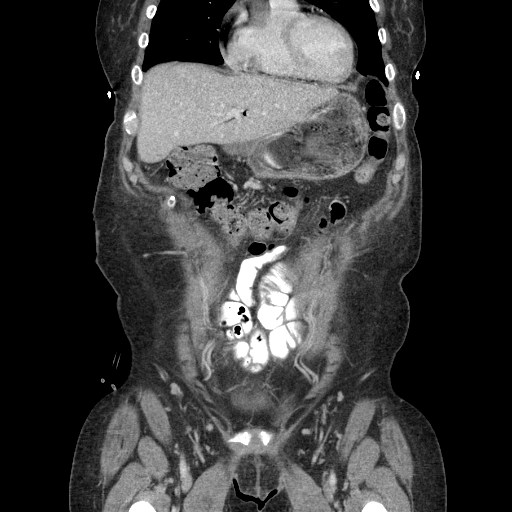
[im 36/107  bone]
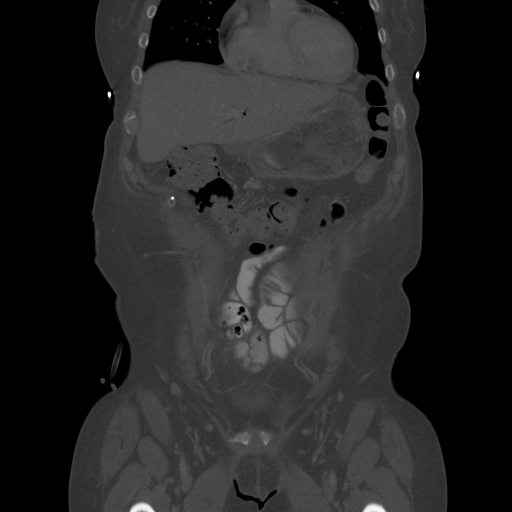

[Series 602: sagittal body · sagittal · 0.92mm/px · 3 of 154 slices shown]
[im 10/154  soft-tissue]
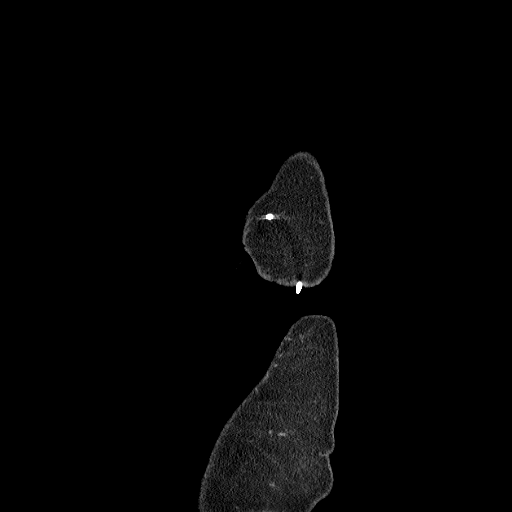
[im 29/154  soft-tissue]
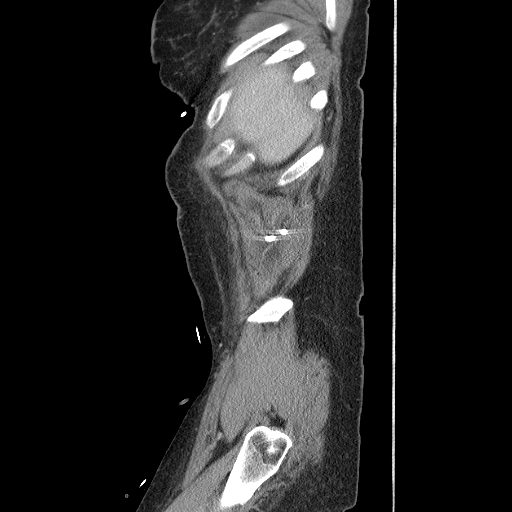
[im 48/154  soft-tissue]
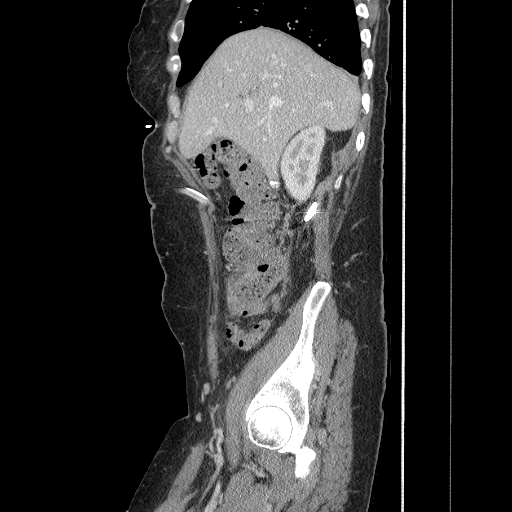

[12 of 36 positions shown; findings below may reference images not displayed]

FINDINGS: Clear lung bases.  Borderline cardiomegaly without
pericardial or pleural effusion.

Normal liver, spleen, stomach. Percutaneous likely terminates at
the distal duodenum and is unchanged in position.  Normal pancreas.
Cholecystectomy without biliary ductal dilatation. Pneumobilia.
Normal adrenal glands.  Interpolar right renal cyst.  Normal left
kidney. No retroperitoneal or retrocrural adenopathy.

6 cm rectal stool ball.  Stool throughout the remainder of the
colon.  Normal caliber of small bowel loops.  2 surgical drains
terminate in the right retrocolic retroperitoneal space.  There has
been resolution of the previously described fluid and gas with only
residual fascial thickening identified.  Similarly, the small right
perirenal fluid gas collection is resolved.  There is fascial
thickening in this area on image 35 of series 3, but no residual
drainable fluid.

Along the posterior right liver capsule, a 1.4 x 1.5 cm fluid
collection on image 27 of series 3 is at the site of gas on the
prior exam.

An area of soft tissue density just inferior to this, along the
right renal fascia measures 1.0 cm on image 30 and is similar to on
the prior. Likely residual phlegmon.

No ascites. No pelvic adenopathy.    Normal urinary bladder and
uterus.  No adnexal mass.  No significant free fluid.

The fluid collection within the midline ventral abdominal/pelvic
wall measures 4.0 x 2.1 cm on image 54 versus 4.3 x 1.5 cm on the
prior exam.  Small locules of gas within.  4.6 cm cranial caudal on
coronal image 31, unchanged.  A more cephalad collection of
anterior abdominal wall gas and fluid measures 1.5 x 3.3 cm on
image 48 and is similar. No acute osseous abnormality.
IMPRESSION: 1.  Improved appearance of the right upper quadrant.  Resolution of
gas and fluid surrounding surgical drains and within the right
posterior pararenal space.  Only fascial thickening in these  areas
remain.
2.  Two tiny, non drainable foci of residual fluid or phlegmon more
superiorly positioned along the liver capsule and right renal
fascia.
3.  Constipation and possible fecal impaction.
4.  Anterior abdominal/pelvic fluid gas collections.  The more
inferior may be slightly enlarged since the prior.

## 2013-09-16 ENCOUNTER — Encounter (INDEPENDENT_AMBULATORY_CARE_PROVIDER_SITE_OTHER): Payer: Self-pay

## 2015-03-10 ENCOUNTER — Other Ambulatory Visit: Payer: Self-pay | Admitting: Obstetrics and Gynecology

## 2015-03-11 LAB — CYTOLOGY - PAP

## 2017-04-14 ENCOUNTER — Other Ambulatory Visit: Payer: Self-pay | Admitting: Obstetrics and Gynecology

## 2017-04-14 DIAGNOSIS — R928 Other abnormal and inconclusive findings on diagnostic imaging of breast: Secondary | ICD-10-CM

## 2017-04-18 ENCOUNTER — Ambulatory Visit
Admission: RE | Admit: 2017-04-18 | Discharge: 2017-04-18 | Disposition: A | Discharge status: Home / Self Care | Payer: Self-pay | Source: Ambulatory Visit | Attending: Obstetrics and Gynecology | Admitting: Obstetrics and Gynecology

## 2017-04-18 DIAGNOSIS — R928 Other abnormal and inconclusive findings on diagnostic imaging of breast: Secondary | ICD-10-CM

## 2019-01-23 ENCOUNTER — Encounter (HOSPITAL_COMMUNITY): Payer: Self-pay | Admitting: Emergency Medicine

## 2019-01-23 ENCOUNTER — Emergency Department (HOSPITAL_COMMUNITY)
Admission: EM | Admit: 2019-01-23 | Discharge: 2019-01-23 | Disposition: A | Discharge status: Home / Self Care | Payer: BLUE CROSS/BLUE SHIELD | Attending: Emergency Medicine | Admitting: Emergency Medicine

## 2019-01-23 ENCOUNTER — Emergency Department (HOSPITAL_COMMUNITY): Payer: BLUE CROSS/BLUE SHIELD

## 2019-01-23 DIAGNOSIS — K529 Noninfective gastroenteritis and colitis, unspecified: Secondary | ICD-10-CM | POA: Diagnosis not present

## 2019-01-23 DIAGNOSIS — Z85828 Personal history of other malignant neoplasm of skin: Secondary | ICD-10-CM | POA: Diagnosis not present

## 2019-01-23 DIAGNOSIS — Z79899 Other long term (current) drug therapy: Secondary | ICD-10-CM | POA: Diagnosis not present

## 2019-01-23 DIAGNOSIS — Z87891 Personal history of nicotine dependence: Secondary | ICD-10-CM | POA: Insufficient documentation

## 2019-01-23 DIAGNOSIS — R1013 Epigastric pain: Secondary | ICD-10-CM

## 2019-01-23 DIAGNOSIS — R109 Unspecified abdominal pain: Secondary | ICD-10-CM | POA: Diagnosis present

## 2019-01-23 LAB — CBC WITH DIFFERENTIAL/PLATELET
Abs Immature Granulocytes: 0.02 10*3/uL (ref 0.00–0.07)
BASOS ABS: 0 10*3/uL (ref 0.0–0.1)
Basophils Relative: 0 %
EOS ABS: 0 10*3/uL (ref 0.0–0.5)
EOS PCT: 0 %
HCT: 39.9 % (ref 36.0–46.0)
Hemoglobin: 12.9 g/dL (ref 12.0–15.0)
Immature Granulocytes: 0 %
LYMPHS PCT: 14 %
Lymphs Abs: 0.9 10*3/uL (ref 0.7–4.0)
MCH: 30.6 pg (ref 26.0–34.0)
MCHC: 32.3 g/dL (ref 30.0–36.0)
MCV: 94.5 fL (ref 80.0–100.0)
Monocytes Absolute: 0.5 10*3/uL (ref 0.1–1.0)
Monocytes Relative: 8 %
NRBC: 0 % (ref 0.0–0.2)
Neutro Abs: 5.3 10*3/uL (ref 1.7–7.7)
Neutrophils Relative %: 78 %
PLATELETS: 286 10*3/uL (ref 150–400)
RBC: 4.22 MIL/uL (ref 3.87–5.11)
RDW: 13.3 % (ref 11.5–15.5)
WBC: 6.8 10*3/uL (ref 4.0–10.5)

## 2019-01-23 LAB — COMPREHENSIVE METABOLIC PANEL
ALK PHOS: 33 U/L — AB (ref 38–126)
ALT: 22 U/L (ref 0–44)
ANION GAP: 10 (ref 5–15)
AST: 28 U/L (ref 15–41)
Albumin: 4.2 g/dL (ref 3.5–5.0)
BUN: 17 mg/dL (ref 6–20)
CALCIUM: 10 mg/dL (ref 8.9–10.3)
CO2: 28 mmol/L (ref 22–32)
Chloride: 104 mmol/L (ref 98–111)
Creatinine, Ser: 0.78 mg/dL (ref 0.44–1.00)
GFR calc non Af Amer: >60 mL/min (ref >60)
Glucose, Bld: 103 mg/dL — ABNORMAL HIGH (ref 70–99)
Potassium: 3.7 mmol/L (ref 3.5–5.1)
SODIUM: 142 mmol/L (ref 135–145)
TOTAL PROTEIN: 6.5 g/dL (ref 6.5–8.1)
Total Bilirubin: 0.8 mg/dL (ref 0.3–1.2)

## 2019-01-23 LAB — LIPASE, BLOOD: Lipase: 30 U/L (ref 11–51)

## 2019-01-23 MED ORDER — IOHEXOL 300 MG/ML  SOLN
100.0000 mL | Freq: Once | INTRAMUSCULAR | Status: AC | PRN
  Administered 2019-01-23: 100 mL via INTRAVENOUS

## 2019-01-23 MED ORDER — ONDANSETRON 4 MG PO TBDP: 4.0000 mg | ORAL_TABLET | Freq: Three times a day (TID) | ORAL | 0 refills | Status: AC | PRN

## 2019-01-23 MED ORDER — ONDANSETRON HCL 4 MG/2ML IJ SOLN
4.0000 mg | Freq: Once | INTRAMUSCULAR | Status: AC
  Administered 2019-01-23: 4 mg via INTRAVENOUS
  Filled 2019-01-23: qty 2

## 2019-01-23 MED ORDER — SODIUM CHLORIDE 0.9 % IV BOLUS
500.0000 mL | Freq: Once | INTRAVENOUS | Status: AC
  Administered 2019-01-23: 500 mL via INTRAVENOUS

## 2019-01-23 NOTE — ED Triage Notes (Signed)
Pt reports abd pain onset last night with N/V. More epigastric in nature, currently pain free. States hx of duodenal ulcer with perforation.

## 2019-01-23 NOTE — Discharge Instructions (Signed)
Watch for increasing abdominal pain or swelling.  Slowly advance her diet as tolerated.  Return for worsening symptoms.

## 2019-01-23 NOTE — ED Notes (Signed)
Patient verbalizes understanding of medications and discharge instructions. No further questions at this time. VSS and patient ambulatory at discharge.   

## 2019-01-23 NOTE — ED Provider Notes (Signed)
[facility] EMERGENCY DEPARTMENT Provider Note   CSN: [id number] Arrival date & time: 01/23/19  2014     History   Chief Complaint Chief Complaint  Patient presents with  . Abdominal Pain    HPI Stacie Clayton is a 58 y.o. female.  HPI Patient presents with abdominal pain.  Last night had some nausea and vomiting and has had some episodes today.  Today had feeling of almost fluid coming out of her stomach.  Had previous perforated duodenum as a complication of ERCP.  No fevers.  Has been sleeping more today however.  Has been taking Zantac and Protonix.  Pain will come and go.  Normally has a sharp pain will come and go this 1 was different.  Has been coming around every hour and a half.  No lightheadedness dizziness but has felt more fatigued.  No diarrhea constipation.  No black stool. Past Medical History:  Diagnosis Date  . Abdominal pain, right lateral    right flank pain that goes around to back   . Abdominal wall bulge    epigastric bluge that is more noticable after eating and drinking and is  painful to the touch   . Anemia   . Blood transfusion 06/2011-4 UNITS rbc'S, 2 uNITS FROZEN PLASMA  . Cancer ([place])    melanoma on back  . Complication of anesthesia   . Duodenal perforation ([place])   . Hyperlipidemia    normal now but has had in the past...per pt  . Incisional hernia   . Incisional hernia with distasis s/p repair Dec 2012 09/27/2011  . PONV (postoperative nausea and vomiting)   . Weight loss     Patient Active Problem List   Diagnosis Date Noted  . Incisional hernia with distasis s/p repair Dec 2012 09/27/2011    Past Surgical History:  Procedure Laterality Date  . APPENDECTOMY     "as a child"  . CHOLECYSTECTOMY    . ERCP W/ SPHICTEROTOMY  06/14/11  . MELANOMA EXCISION     back  . Repair of duodenal perforation  06/19/2011   Perforation from ERCP  . TONSILLECTOMY     "as a child"  . TUBAL LIGATION  1986  . tubal reversal  1997  .  VENTRAL HERNIA REPAIR  12/08/11   open w/lysis of adhesions  . VENTRAL HERNIA REPAIR  12/08/2011   Procedure: HERNIA REPAIR VENTRAL ADULT;  Surgeon: Stark Klein, MD;  Location: [facility];  Service: General;  Laterality: N/A;  OPEN VENTRAL HERNIA REPAIR WITH LYSIS OF ADHESIONS     OB History   No obstetric history on file.      Home Medications    Prior to Admission medications   Medication Sig Start Date End Date Taking? Authorizing Provider  acetaminophen (TYLENOL) 325 MG tablet Take 650 mg by mouth every 6 (six) hours as needed. For pain/fever     [provider]  calcium carbonate (OS-CAL - DOSED IN MG OF ELEMENTAL CALCIUM) 1250 MG tablet Take 1 tablet by mouth daily.      [provider]  HYDROcodone-acetaminophen (NORCO) 5-325 MG per tablet Take 1 tablet by mouth every 6 (six) hours as needed. 01/03/12   Stark Klein, MD  Multiple Vitamins-Minerals (MULTIVITAMINS THER. W/MINERALS) TABS Take 1 tablet by mouth daily.      [provider]  ondansetron (ZOFRAN-ODT) 4 MG disintegrating tablet Take 1 tablet (4 mg total) by mouth every 8 (eight) hours as needed for nausea or  vomiting. 01/23/19   Davonna Belling, MD  pantoprazole (PROTONIX) 40 MG tablet Take 40 mg by mouth 2 (two) times daily. Per patient she only takes one pill per day.    [provider]  Prenatal Vit-Fe Fumarate-FA (MULTIVITAMIN-PRENATAL) 27-0.8 MG TABS Take 1 tablet by mouth daily.      [provider]  ranitidine (ZANTAC) 150 MG tablet Take 150 mg by mouth daily.    [provider]    Family History Family History  Problem Relation Age of Onset  . Cancer Mother        lung  . Heart disease Father        heart attack    Social History Social History   Tobacco Use  . Smoking status: Former Smoker    Packs/day: 0.50    Years: 10.00    Pack years: 5.00    Types: Cigarettes    Last attempt to quit: 07/13/1986    Years since quitting: 32.5  . Smokeless  tobacco: Never Used  Substance Use Topics  . Alcohol use: No    Comment: last drink "1980"  . Drug use: No    Comment: "smoked a little pot in high school"     Allergies   Patient has no known allergies.   Review of Systems Review of Systems  Constitutional: Positive for fatigue. Negative for activity change and fever.  HENT: Negative for congestion.   Respiratory: Negative for chest tightness.   Cardiovascular: Negative for chest pain.  Gastrointestinal: Positive for abdominal pain, nausea and vomiting.  Genitourinary: Negative for flank pain.  Musculoskeletal: Negative for back pain.  Skin: Negative for rash.  Neurological: Negative for light-headedness.  Hematological: Negative for adenopathy.  Psychiatric/Behavioral: Negative for confusion.     Physical Exam Updated Vital Signs BP (!) 149/82 (BP Location: Right Arm)   Pulse 86   Temp 98.4 F (36.9 C) (Oral)   Resp 17   Ht 5\' 1"  (1.549 m)   Wt 54.4 kg   SpO2 99%   BMI 22.67 kg/m   Physical Exam HENT:     Head: Atraumatic.  Pulmonary:     Breath sounds: Normal breath sounds.  Abdominal:     Tenderness: There is abdominal tenderness.     Comments: Initially has mild epigastric tenderness.  No mass.  Later has a recurrence of the pain and feels more full in the epigastric area at this time.  No hernias palpated.  Skin:    General: Skin is warm.     Capillary Refill: Capillary refill takes less than 2 seconds.  Neurological:     General: No focal deficit present.     Mental Status: She is alert.      ED Treatments / Results  Labs (all labs ordered are listed, but only abnormal results are displayed) Labs Reviewed  COMPREHENSIVE METABOLIC PANEL - Abnormal; Notable for the following components:      Result Value   Glucose, Bld 103 (*)    Alkaline Phosphatase 33 (*)    All other components within normal limits  LIPASE, BLOOD  CBC WITH DIFFERENTIAL/PLATELET    EKG None  Radiology Ct Abdomen  Pelvis W Contrast  Result Date: 01/23/2019 CLINICAL DATA:  Abdominal pain, nausea and vomiting. History of perforated duodenal ulcer, ventral hernia repair, appendectomy, cholecystectomy. EXAM: CT ABDOMEN AND PELVIS WITH CONTRAST TECHNIQUE: Multidetector CT imaging of the abdomen and pelvis was performed using the standard protocol following bolus administration of intravenous contrast. CONTRAST:  121mL  OMNIPAQUE IOHEXOL 300 MG/ML  SOLN COMPARISON:  CTA chest, abdomen and pelvis September 23, 2011. FINDINGS: LOWER CHEST: Lung bases are clear. Included heart size is normal. No pericardial effusion. HEPATOBILIARY: Status post cholecystectomy. Minimal pneumobilia, unchanged. Otherwise negative liver. PANCREAS: Normal. SPLEEN: Normal. ADRENALS/URINARY TRACT: Kidneys are orthotopic, demonstrating symmetric enhancement. No nephrolithiasis, hydronephrosis or solid renal masses. 3 cm homogeneously hypodense benign-appearing interpolar cyst. The unopacified ureters are normal in course and caliber. Delayed imaging through the kidneys demonstrates symmetric prompt contrast excretion within the proximal urinary collecting system. Urinary bladder is decompressed and unremarkable. Normal adrenal glands. STOMACH/BOWEL: Fluid distended small bowel measuring to 3.8 cm with multiple transition points prominent in the central abdomen. Mild small-bowel mural hyperemia. Small amount of small bowel feces. Fluid within the ascending and transverse colon. VASCULAR/LYMPHATIC: Aortoiliac vessels are normal in course and caliber. Mild calcific atherosclerosis. No lymphadenopathy by CT size criteria. REPRODUCTIVE: Negative. OTHER: Trace free fluid in central abdomen is likely physiologic reactive. No intraperitoneal free air or focal fluid collections. MUSCULOSKELETAL: Nonacute.  Anterior abdominal wall scarring. IMPRESSION: 1. Dilated small bowel with multiple transition points and fluid filled RIGHT colon; findings most compatible with  enteritis and ileus, less likely partial small bowel obstruction. 2.  Aortic Atherosclerosis (ICD10-I70.0). Electronically Signed   By: Elon Alas M.D.   On: 01/23/2019 22:45    Procedures Procedures (including critical care time)  Medications Ordered in ED Medications  sodium chloride 0.9 % bolus 500 mL (0 mLs Intravenous Stopped 01/23/19 2146)  ondansetron (ZOFRAN) injection 4 mg (4 mg Intravenous Given 01/23/19 2113)  iohexol (OMNIPAQUE) 300 MG/ML solution 100 mL (100 mLs Intravenous Contrast Given 01/23/19 2226)     Initial Impression / Assessment and Plan / ED Course  I have reviewed the triage vital signs and the nursing notes.  Pertinent labs & imaging results that were available during my care of the patient were reviewed by me and considered in my medical decision making (see chart for details).     Patient with epigastric pain.  Has had some nausea and vomiting.  Previous duodenal perforation and was worried about new perforation.  CT scan does show ileus versus enteritis versus less likely small bowel obstruction.  Still passing gas.  Patient feels that she can give this a try at home.  I have discussed with Dr. Silverio Decamp who will help arrange follow-up with [facility] gastroenterology.  Lab work reassuring.  Discharge home.  Has tolerated orals here.  Final Clinical Impressions(s) / ED Diagnoses   Final diagnoses:  Epigastric pain  Enteritis    ED Discharge Orders         Ordered    ondansetron (ZOFRAN-ODT) 4 MG disintegrating tablet  Every 8 hours PRN     01/23/19 2258           Davonna Belling, MD 01/23/19 2301

## 2019-01-24 ENCOUNTER — Telehealth: Payer: Self-pay

## 2019-01-24 NOTE — Telephone Encounter (Signed)
Spoke with the patient. Her husband is getting the name of GI that she wants to see.  She will call us back when she is ready to schedule her new patient appointment.

## 2019-01-24 NOTE — Telephone Encounter (Signed)
-----   Message from Mauri Pole, MD sent at 01/24/2019 11:58 AM EST ----- Regarding: RE: patient follow up. Beth, please schedule Ms Delauter with next available any provide soon, new patient visit.  Thanks VN ----- Message ----- From: Davonna Belling, MD Sent: 01/23/2019  10:55 PM EST To: Mauri Pole, MD Subject: patient follow up.                             Here is the patient I talked to about for follow-up.  CT scan was done and showed ileus versus enteritis but less likely bowel obstruction.  She is tolerated orals and feeling better.  However has been having pain on and off and feel would still benefit from follow-up.  Thanks  [employer]

## 2019-01-24 NOTE — Telephone Encounter (Signed)
Benjamine Mola, I spoke with the patient's husband, Dr. Royce Macadamia this afternoon.   Please proceed with attempting to get a clinic visit in the next 1-2 weeks with me.   OK to book into a 15 minute slot if necessary. Based on her symptomatology she will considered for an enteroscopy +/- Side viewing endoscopy in the future. In the interim before she establishes with [place] however, she will need to follow up with PCP or if in significant distress return to ED. Thanks.  Justice Britain, MD [facility] Office # [phone]

## 2019-01-25 NOTE — Telephone Encounter (Signed)
appt made for 2/21 at 215 pm.  Pt aware

## 2019-01-30 ENCOUNTER — Other Ambulatory Visit: Payer: Self-pay

## 2019-02-05 ENCOUNTER — Encounter: Payer: Self-pay | Admitting: Gastroenterology

## 2019-02-08 ENCOUNTER — Encounter: Payer: Self-pay | Admitting: Gastroenterology

## 2019-02-08 ENCOUNTER — Ambulatory Visit (INDEPENDENT_AMBULATORY_CARE_PROVIDER_SITE_OTHER): Payer: BLUE CROSS/BLUE SHIELD | Admitting: Gastroenterology

## 2019-02-08 ENCOUNTER — Other Ambulatory Visit (INDEPENDENT_AMBULATORY_CARE_PROVIDER_SITE_OTHER): Payer: BLUE CROSS/BLUE SHIELD

## 2019-02-08 VITALS — BP 110/72 | HR 67 | Ht 61.0 in | Wt 122.0 lb

## 2019-02-08 DIAGNOSIS — Z9049 Acquired absence of other specified parts of digestive tract: Secondary | ICD-10-CM

## 2019-02-08 DIAGNOSIS — R935 Abnormal findings on diagnostic imaging of other abdominal regions, including retroperitoneum: Secondary | ICD-10-CM | POA: Diagnosis not present

## 2019-02-08 DIAGNOSIS — R634 Abnormal weight loss: Secondary | ICD-10-CM | POA: Diagnosis not present

## 2019-02-08 DIAGNOSIS — R630 Anorexia: Secondary | ICD-10-CM | POA: Diagnosis not present

## 2019-02-08 DIAGNOSIS — R1013 Epigastric pain: Secondary | ICD-10-CM

## 2019-02-08 LAB — LIPASE: LIPASE: 36 U/L (ref 11.0–59.0)

## 2019-02-08 LAB — HEPATIC FUNCTION PANEL
ALBUMIN: 4.7 g/dL (ref 3.5–5.2)
ALK PHOS: 38 U/L — AB (ref 39–117)
ALT: 18 U/L (ref 0–35)
AST: 23 U/L (ref 0–37)
Bilirubin, Direct: 0.1 mg/dL (ref 0.0–0.3)
TOTAL PROTEIN: 6.8 g/dL (ref 6.0–8.3)
Total Bilirubin: 0.5 mg/dL (ref 0.2–1.2)

## 2019-02-08 LAB — IGA: IGA: 95 mg/dL (ref 68–378)

## 2019-02-08 LAB — HIGH SENSITIVITY CRP: CRP HIGH SENSITIVITY: 0.2 mg/L (ref 0.000–5.000)

## 2019-02-08 LAB — SEDIMENTATION RATE: SED RATE: 3 mm/h (ref 0–30)

## 2019-02-08 LAB — AMYLASE: AMYLASE: 65 U/L (ref 27–131)

## 2019-02-08 MED ORDER — PANTOPRAZOLE SODIUM 40 MG PO TBEC: 40.0000 mg | DELAYED_RELEASE_TABLET | Freq: Two times a day (BID) | ORAL | 2 refills | Status: DC

## 2019-02-08 NOTE — Progress Notes (Signed)
GASTROENTEROLOGY OUTPATIENT CLINIC VISIT   Primary Care Provider Molli Posey, [place] [address] [phone]  Referring Provider Molli Posey, [place], [place] [address] [phone]  Patient Profile: Stacie Clayton is a 58 y.o. female with a pmh significant for status post cholecystectomy, status post ERCP with sphincterotomy with perforated duodenum status post patch, incisional hernia status post repair polyp, skin cancer status post excision, recurrent abdominal pain not specified.  The patient presents to the [facility] for an evaluation and management of problem(s) noted below:  Problem List 1. Epigastric pain   2. Loss of weight   3. Decreased appetite   4. Abnormal CT of the abdomen   5. History of cholecystectomy     History of Present Illness: This is the patient's first visit to the outpatient [facility].  The patient has had recurrent abdominal pain of an unclear etiology for years.  In 2011 she underwent an EUS that showed a CBD of 5 mm with a gallbladder of normal thickness with stones and sludge and otherwise a normal pancreas per report.  Subsequently in 2012 she underwent a cholecystectomy for concern of symptomatic cholelithiasis.  She returned to the hospital with abnormal liver tests and underwent an ERCP with concern for possible retained stone.  After an ERCP with sphincterotomy with negative cholangiogram she was discharged but returned within the next week with significant worsening abdominal pain.  She was found to have a perforation.  This was fixed surgically.  She underwent later that year and incisional/ventral hernia repair.  Since that point in time the patient states that she has continued to have the same type of pain she had before her gallbladder came out.  Her pain is described as midepigastric in origin.  It comes about when she drinks coffee and  can last for a few seconds or a few minutes and then goes away.  At times it is in the right upper quadrant as well.  There are no aggravating factors.  She does not know that there is a postprandial component to her pain.  Usually with time it will subside and will go away on its own without the need of taking medications.  She has previously been prescribed Bentyl for spasms but has not needed that because usually by the time she gets the medicine the discomfort has gone.  The patient presented to the hospital for further evaluation in the setting of recurrent abdominal pain, though this was different than what she has experienced in the past, with associated nausea.  She normally has 1 bowel movement on a daily basis with no melena or hematochezia.  The patient was noted to have diarrhea on the day that she came in to the ED and underwent cross-sectional imaging as noted below concerning for possible ileus with potential multiple transition points versus a partial small bowel obstruction.  However with pain medication and a bit of time in the ED things subsided and she was able to be discharged home.  After that morning she was feeling well and has subsequently not had any recurrence of that type of pain.  She is gone back to having her normal bowel movements once daily.  She has been losing weight slightly but this is been intentional.  She walks up to 10 miles per day.  Over the course of the last year and a half she has had a 15 pound weight  loss.  She believes that her appetite is not great and has not been like that for a long period of time.  She has not had an upper endoscopy since her EGD EUS.  She is never had a colonoscopy for screening.   GI Review of Systems Positive as above including mild early satiety Negative for odynophagia, dysphagia, jaundice, change in bowel habits, melena, hematochezia  Review of Systems General: Denies fevers/chills HEENT: Denies oral lesions Cardiovascular: Denies  chest pain/palpitations Pulmonary: Denies shortness of breath/cough Gastroenterological: See HPI Genitourinary: Denies darkened urine or hematuria Hematological: Denies easy bruising/bleeding Endocrine: Denies temperature intolerance Dermatological: Denies jaundice Psychological: Mood is stable Musculoskeletal: Denies new arthralgias   Medications Current Outpatient Medications  Medication Sig Dispense Refill  . acetaminophen (TYLENOL) 325 MG tablet Take 650 mg by mouth every 6 (six) hours as needed. For pain/fever     . calcium carbonate (OS-CAL - DOSED IN MG OF ELEMENTAL CALCIUM) 1250 MG tablet Take 1 tablet by mouth daily.      Marland Kitchen HYDROcodone-acetaminophen (NORCO) 5-325 MG per tablet Take 1 tablet by mouth every 6 (six) hours as needed. 30 tablet 1  . Multiple Vitamins-Minerals (MULTIVITAMINS THER. W/MINERALS) TABS Take 1 tablet by mouth daily.      . ondansetron (ZOFRAN-ODT) 4 MG disintegrating tablet Take 1 tablet (4 mg total) by mouth every 8 (eight) hours as needed for nausea or vomiting. 8 tablet 0  . Prenatal Vit-Fe Fumarate-FA (MULTIVITAMIN-PRENATAL) 27-0.8 MG TABS Take 1 tablet by mouth daily.      . pantoprazole (PROTONIX) 40 MG tablet Take 1 tablet (40 mg total) by mouth 2 (two) times daily. 30 tablet 2   No current facility-administered medications for this visit.     Allergies No Known Allergies  Histories Past Medical History:  Diagnosis Date  . Abdominal pain, right lateral    right flank pain that goes around to back   . Abdominal wall bulge    epigastric bluge that is more noticable after eating and drinking and is  painful to the touch   . Anemia   . Blood transfusion 06/2011-4 UNITS rbc'S, 2 uNITS FROZEN PLASMA  . Cancer ([place])    melanoma on back  . Complication of anesthesia   . Duodenal perforation ([place])   . Hyperlipidemia    normal now but has had in the past...per pt  . Incisional hernia   . Incisional hernia with distasis s/p repair Dec 2012  09/27/2011  . PONV (postoperative nausea and vomiting)   . Weight loss    Past Surgical History:  Procedure Laterality Date  . APPENDECTOMY     "as a child"  . CHOLECYSTECTOMY    . ERCP W/ SPHICTEROTOMY  06/14/11  . MELANOMA EXCISION     back  . Repair of duodenal perforation  06/19/2011   Perforation from ERCP  . TONSILLECTOMY     "as a child"  . TUBAL LIGATION  1986  . tubal reversal  1997  . VENTRAL HERNIA REPAIR  12/08/11   open w/lysis of adhesions  . VENTRAL HERNIA REPAIR  12/08/2011   Procedure: HERNIA REPAIR VENTRAL ADULT;  Surgeon: Stark Klein, MD;  Location: [facility];  Service: General;  Laterality: N/A;  OPEN VENTRAL HERNIA REPAIR WITH LYSIS OF ADHESIONS   Social History   Socioeconomic History  . Marital status: Married    Spouse name: Not on file  . Number of children: Not on file  . Years of education: Not on file  .  Highest education level: Not on file  Occupational History  . Not on file  Social Needs  . Financial resource strain: Not on file  . Food insecurity:    Worry: Not on file    Inability: Not on file  . Transportation needs:    Medical: Not on file    Non-medical: Not on file  Tobacco Use  . Smoking status: Former Smoker    Packs/day: 0.50    Years: 10.00    Pack years: 5.00    Types: Cigarettes    Last attempt to quit: 07/13/1986    Years since quitting: 32.6  . Smokeless tobacco: Never Used  Substance and Sexual Activity  . Alcohol use: No    Comment: last drink "1980"  . Drug use: No    Comment: "smoked a little pot in high school"  . Sexual activity: Yes    Partners: Female  Lifestyle  . Physical activity:    Days per week: Not on file    Minutes per session: Not on file  . Stress: Not on file  Relationships  . Social connections:    Talks on phone: Not on file    Gets together: Not on file    Attends religious service: Not on file    Active member of club or organization: Not on file    Attends meetings of clubs or  organizations: Not on file    Relationship status: Not on file  . Intimate partner violence:    Fear of current or ex partner: Not on file    Emotionally abused: Not on file    Physically abused: Not on file    Forced sexual activity: Not on file  Other Topics Concern  . Not on file  Social History Narrative  . Not on file   Family History  Problem Relation Age of Onset  . Lung cancer Mother   . Heart disease Father        heart attack  . Colon cancer Neg Hx   . Esophageal cancer Neg Hx   . Inflammatory bowel disease Neg Hx   . Liver disease Neg Hx   . Pancreatic cancer Neg Hx   . Rectal cancer Neg Hx   . Stomach cancer Neg Hx    I have reviewed her medical, social, and family history in detail and updated the electronic medical record as necessary.    PHYSICAL EXAMINATION  BP 110/72   Pulse 67   Ht 5\' 1"  (1.549 m)   Wt 122 lb (55.3 kg)   BMI 23.05 kg/m  Wt Readings from Last 3 Encounters:  02/08/19 122 lb (55.3 kg)  01/23/19 120 lb (54.4 kg)  02/14/12 131 lb 12.8 oz (59.8 kg)  GEN: NAD, appears stated age, doesn't appear chronically ill PSYCH: Cooperative, without pressured speech EYE: Conjunctivae pink, sclerae anicteric ENT: MMM, without oral ulcers, no erythema or exudates noted NECK: Supple CV: RR without R/Gs  RESP: CTAB posteriorly, without wheezing GI: NABS, soft, NT/ND, surgical midline scar noted without significant herniation present, without rebound or guarding, no HSM appreciated MSK/EXT: No lower extremity edema SKIN: No jaundice NEURO:  Alert & Oriented x 3, no focal deficits   REVIEW OF DATA  I reviewed the following data at the time of this encounter:  GI Procedures and Studies  Previous EUS & ERCP as noted above in HPI  Laboratory Studies  Review in EPIC  Imaging Studies  2/20 CTAP with contrast IMPRESSION: 1. Dilated small bowel  with multiple transition points and fluid filled RIGHT colon; findings most compatible with enteritis  and ileus, less likely partial small bowel obstruction. 2.  Aortic Atherosclerosis (ICD10-I70.0).   ASSESSMENT  Ms. Taylor is a 58 y.o. female  with a pmh significant for status post cholecystectomy, status post ERCP with sphincterotomy with perforated duodenum status post patch, incisional hernia status post repair polyp, skin cancer status post excision, recurrent abdominal pain not specified.  The patient is seen today for evaluation and management of:  1. Epigastric pain   2. Loss of weight   3. Decreased appetite   4. Abnormal CT of the abdomen   5. History of cholecystectomy    This is a hemodynamically stable patient who is currently clinically well.  The patient's etiology of her recurrent abdominal discomforts is not clearly delineated at this point in time.  It seems what was previously felt to potentially be biliary colic after cholecystectomy did not have significant improvement.  Unfortunately after concerns of the possibility of a retained gallstone in the bile duct she developed the need for an ERCP with complications of post ERCP perforation requiring patching.  The patient's recurrent symptoms sound spasm-like in origin and usually only last a few seconds or minutes but they do definitely keep her from being able to continue to do anything when they do occur.  The more recent event with her presentation to the hospital suggested significant abnormalities in her small bowel that would normally be suggestive of an underlying enteritis or infectious process however, the patient improved so significantly as her quickly I am not completely clear as to why her findings were so significant.  Consideration of whether the patient could have a angioedema picture is considered.  The possibility of having a structural issue as a result of her prior significant surgical intervention as a result of adhesive disease may need to be considered.  However, this still goes back to the unclear etiology of  the primary pain before any surgery as to why she is having back discomfort.  I think it is reasonable based on her findings for [place] to have labs pending as future orders if she has significant recurrence of pain and to come back within 6 to 10 hours with pain to we may see if her liver tests or pancreatic enzymes elevate.  I think it is reasonable also to perform an upper GI evaluation to evaluate for gastritis and peptic ulcer disease and in the setting of her previous significant issues to proceed with an ultrasound endoscopy to ensure that she does not have anything that would look like a retained stone within the biliary tree.  We know that pneumobilia can sometimes make evaluation of the CBD more difficult but I think it is a reasonable next step.  I would like to see what her imaging looks like when she is doing as well as she can.  Thus we will proceed with a CT enterography and approximately 2 to 3 weeks and then plan for diagnostic endoscopy to be done thereafter.  She is never had colon cancer screening and although her symptoms do not sound to be consistent with an underlying malignancy it is reasonable to get her up-to-date with this.  We will plan an upper EGD/EUS with colonoscopy in the next few weeks after her CT enterography is completed.  The risks and benefits of endoscopic evaluation were discussed with the patient; these include but are not limited to the risk of perforation, infection, bleeding,  missed lesions, lack of diagnosis, severe illness requiring hospitalization, as well as anesthesia and sedation related illnesses.  The patient is agreeable to proceed.  The risks of EUS including bleeding, infection, aspiration pneumonia and intestinal perforation were discussed as was the possibility it may not give a definitive diagnosis.  If a biopsy of the pancreas is done as part of the EUS, there is an additional risk of pancreatitis at the rate of about 1%.  It was explained that procedure  related pancreatitis is typically mild, although can be severe and even life threatening, which is why we do not perform random pancreatic biopsies and only biopsy a lesion we feel is concerning enough to warrant the risk.  All patient questions were answered, to the best of my ability, and the patient agrees to the aforementioned plan of action with follow-up as indicated.   PLAN  Laboratories as outlined below CT enterography in the next 2 to 3 weeks Diagnostic EGD/EUS plus screening colonoscopy to be scheduled Further evaluation will be due after the work-up is completed above Laboratories for hepatic function, [place]/lites will be pending as future orders to be done in the future if she has recurrence of her pain to be done in 6 to 10 hours   Orders Placed This Encounter  Procedures  . CT ENTERO ABD/PELVIS W CONTAST  . Amylase  . Lipase  . Hepatic function panel  . Cortisol  . Sedimentation rate  . CRP High sensitivity  . IgA  . Tissue transglutaminase, IgA    New Prescriptions   PANTOPRAZOLE (PROTONIX) 40 MG TABLET    Take 1 tablet (40 mg total) by mouth 2 (two) times daily.   Modified Medications   No medications on file    Planned Follow Up: No follow-ups on file.   Justice Britain, MD [facility] Office # [phone]

## 2019-02-08 NOTE — Patient Instructions (Addendum)
Your provider has requested that you go to the basement level for lab work before leaving today. Press "B" on the elevator. The lab is located at the first door on the left as you exit the elevator.   You will also need to have future labs in 1 month. Please come to our lab located in basement of our building.   You have been scheduled for a CT scan of the abdomen and pelvis at [facility] ([address]---this is in the same building as Press [facility]).   You are scheduled on 03/01/2019 at 11:00am. You should arrive @ 9:30am for registration and prep. Please follow the written instructions below on the day of your exam:  WARNING: IF YOU ARE ALLERGIC TO IODINE/X-RAY DYE, PLEASE NOTIFY RADIOLOGY IMMEDIATELY AT [phone]! YOU WILL BE GIVEN A 13 HOUR PREMEDICATION PREP.  1) Do not eat or drink anything after 7:00am(4 hours prior to your test)  You may take any medications as prescribed with a small amount of water, if necessary. If you take any of the following medications: METFORMIN, GLUCOPHAGE, GLUCOVANCE, AVANDAMET, RIOMET, FORTAMET, Dorris MET, JANUMET, GLUMETZA or METAGLIP, you MAY be asked to HOLD this medication 48 hours AFTER the exam.  The purpose of you drinking the oral contrast is to aid in the visualization of your intestinal tract. The contrast solution may cause some diarrhea. Depending on your individual set of symptoms, you may also receive an intravenous injection of x-ray contrast/dye. Plan on being at [facility] for 30 minutes or longer, depending on the type of exam you are having performed.  This test typically takes 30-45 minutes to complete.  If you have any questions regarding your exam or if you need to reschedule, you may call the CT department at [phone] between the hours of 8:00 am and 5:00 pm, Monday-Friday.  Stop Zantac  Start Protonix 83m twice daily.   It has been recommended to you by your physician that you have a(n)  [facility]. Per your request, we did not schedule the procedure(s) today. Please contact our office at [phone] you decide to have the procedure completed.   Thank you for choosing [facility] and [facility].  Dr. MRush Landmark

## 2019-02-10 ENCOUNTER — Encounter: Payer: Self-pay | Admitting: Gastroenterology

## 2019-02-11 LAB — CORTISOL: CORTISOL PLASMA: 8.1 ug/dL

## 2019-02-11 LAB — TISSUE TRANSGLUTAMINASE, IGA: (TTG) AB, IGA: 1 U/mL

## 2019-02-12 ENCOUNTER — Other Ambulatory Visit: Payer: Self-pay

## 2019-02-12 DIAGNOSIS — R634 Abnormal weight loss: Secondary | ICD-10-CM

## 2019-02-12 DIAGNOSIS — R630 Anorexia: Secondary | ICD-10-CM

## 2019-02-12 DIAGNOSIS — R1013 Epigastric pain: Secondary | ICD-10-CM

## 2019-02-12 NOTE — Progress Notes (Signed)
Future labs to be drawn in 1 month per Dr Rush Landmark.

## 2019-02-13 DIAGNOSIS — R630 Anorexia: Secondary | ICD-10-CM | POA: Insufficient documentation

## 2019-02-13 DIAGNOSIS — R935 Abnormal findings on diagnostic imaging of other abdominal regions, including retroperitoneum: Secondary | ICD-10-CM | POA: Insufficient documentation

## 2019-02-13 DIAGNOSIS — Z9049 Acquired absence of other specified parts of digestive tract: Secondary | ICD-10-CM | POA: Insufficient documentation

## 2019-02-13 DIAGNOSIS — R1013 Epigastric pain: Secondary | ICD-10-CM | POA: Insufficient documentation

## 2019-02-13 DIAGNOSIS — R634 Abnormal weight loss: Secondary | ICD-10-CM | POA: Insufficient documentation

## 2019-02-14 ENCOUNTER — Other Ambulatory Visit: Payer: Self-pay

## 2019-02-14 DIAGNOSIS — R634 Abnormal weight loss: Secondary | ICD-10-CM

## 2019-02-14 DIAGNOSIS — R1013 Epigastric pain: Secondary | ICD-10-CM

## 2019-02-14 DIAGNOSIS — R630 Anorexia: Secondary | ICD-10-CM

## 2019-02-14 DIAGNOSIS — E538 Deficiency of other specified B group vitamins: Secondary | ICD-10-CM

## 2019-02-14 NOTE — Progress Notes (Signed)
Pt informed of lab results. Will come for labs as ordered.

## 2019-02-15 ENCOUNTER — Other Ambulatory Visit (INDEPENDENT_AMBULATORY_CARE_PROVIDER_SITE_OTHER): Payer: BLUE CROSS/BLUE SHIELD

## 2019-02-15 DIAGNOSIS — E538 Deficiency of other specified B group vitamins: Secondary | ICD-10-CM

## 2019-02-15 DIAGNOSIS — R630 Anorexia: Secondary | ICD-10-CM | POA: Diagnosis not present

## 2019-02-15 DIAGNOSIS — R634 Abnormal weight loss: Secondary | ICD-10-CM

## 2019-02-15 DIAGNOSIS — R1013 Epigastric pain: Secondary | ICD-10-CM | POA: Diagnosis not present

## 2019-02-15 LAB — VITAMIN B12: VITAMIN B 12: 375 pg/mL (ref 211–911)

## 2019-02-15 LAB — TSH: TSH: 1.6 u[IU]/mL (ref 0.35–4.50)

## 2019-02-15 LAB — FOLATE

## 2019-02-18 LAB — ZINC: Zinc: 64 ug/dL (ref 60–130)

## 2019-02-18 LAB — INTRINSIC FACTOR ANTIBODIES: INTRINSIC FACTOR: NEGATIVE

## 2019-03-01 ENCOUNTER — Ambulatory Visit (INDEPENDENT_AMBULATORY_CARE_PROVIDER_SITE_OTHER)
Admission: RE | Admit: 2019-03-01 | Discharge: 2019-03-01 | Disposition: A | Discharge status: Home / Self Care | Payer: BLUE CROSS/BLUE SHIELD | Source: Ambulatory Visit | Attending: Gastroenterology | Admitting: Gastroenterology

## 2019-03-01 ENCOUNTER — Other Ambulatory Visit: Payer: Self-pay

## 2019-03-01 DIAGNOSIS — R634 Abnormal weight loss: Secondary | ICD-10-CM

## 2019-03-01 DIAGNOSIS — R1013 Epigastric pain: Secondary | ICD-10-CM

## 2019-03-01 DIAGNOSIS — R630 Anorexia: Secondary | ICD-10-CM

## 2019-03-01 MED ORDER — IOHEXOL 300 MG/ML  SOLN
100.0000 mL | Freq: Once | INTRAMUSCULAR | Status: AC | PRN
  Administered 2019-03-01: 100 mL via INTRAVENOUS

## 2019-05-02 ENCOUNTER — Other Ambulatory Visit: Payer: Self-pay | Admitting: Gastroenterology

## 2019-07-31 ENCOUNTER — Other Ambulatory Visit: Payer: Self-pay | Admitting: Gastroenterology

## 2019-11-25 ENCOUNTER — Other Ambulatory Visit: Payer: Self-pay | Admitting: Gastroenterology

## 2020-05-18 ENCOUNTER — Other Ambulatory Visit: Payer: Self-pay | Admitting: Gastroenterology

## 2020-05-27 ENCOUNTER — Other Ambulatory Visit: Payer: Self-pay | Admitting: Gastroenterology

## 2020-06-08 ENCOUNTER — Other Ambulatory Visit: Payer: Self-pay | Admitting: Gastroenterology

## 2020-06-22 ENCOUNTER — Other Ambulatory Visit: Payer: Self-pay | Admitting: Gastroenterology

## 2020-07-07 ENCOUNTER — Other Ambulatory Visit: Payer: Self-pay | Admitting: Gastroenterology

## 2020-07-22 ENCOUNTER — Other Ambulatory Visit: Payer: Self-pay | Admitting: Gastroenterology

## 2020-08-06 ENCOUNTER — Other Ambulatory Visit: Payer: Self-pay | Admitting: Gastroenterology

## 2020-08-21 ENCOUNTER — Other Ambulatory Visit: Payer: Self-pay | Admitting: Gastroenterology

## 2020-09-21 ENCOUNTER — Other Ambulatory Visit: Payer: Self-pay | Admitting: Gastroenterology

## 2020-10-18 ENCOUNTER — Other Ambulatory Visit: Payer: Self-pay | Admitting: Gastroenterology

## 2020-11-17 ENCOUNTER — Other Ambulatory Visit: Payer: Self-pay | Admitting: Gastroenterology

## 2020-12-15 ENCOUNTER — Other Ambulatory Visit: Payer: Self-pay | Admitting: Gastroenterology

## 2021-01-14 ENCOUNTER — Other Ambulatory Visit: Payer: Self-pay | Admitting: Gastroenterology

## 2021-02-10 ENCOUNTER — Other Ambulatory Visit: Payer: Self-pay | Admitting: Gastroenterology

## 2021-03-11 ENCOUNTER — Other Ambulatory Visit: Payer: Self-pay | Admitting: Gastroenterology

## 2021-04-13 ENCOUNTER — Other Ambulatory Visit: Payer: Self-pay | Admitting: Gastroenterology

## 2021-05-15 ENCOUNTER — Other Ambulatory Visit: Payer: Self-pay | Admitting: Gastroenterology

## 2021-06-12 ENCOUNTER — Other Ambulatory Visit: Payer: Self-pay | Admitting: Gastroenterology

## 2021-07-15 ENCOUNTER — Other Ambulatory Visit: Payer: Self-pay | Admitting: Gastroenterology

## 2021-08-13 ENCOUNTER — Other Ambulatory Visit: Payer: Self-pay | Admitting: Gastroenterology

## 2021-09-12 ENCOUNTER — Other Ambulatory Visit: Payer: Self-pay | Admitting: Gastroenterology

## 2021-10-11 ENCOUNTER — Other Ambulatory Visit: Payer: Self-pay | Admitting: Gastroenterology

## 2021-11-11 ENCOUNTER — Other Ambulatory Visit: Payer: Self-pay | Admitting: Gastroenterology

## 2023-05-18 ENCOUNTER — Other Ambulatory Visit (HOSPITAL_COMMUNITY): Payer: Self-pay

## 2023-05-18 ENCOUNTER — Other Ambulatory Visit: Payer: Self-pay | Admitting: Gastroenterology

## 2023-05-23 ENCOUNTER — Other Ambulatory Visit (HOSPITAL_COMMUNITY): Payer: Self-pay

## 2023-05-23 ENCOUNTER — Telehealth: Payer: Self-pay | Admitting: Pharmacy Technician

## 2023-05-23 NOTE — Telephone Encounter (Signed)
Patient Advocate Encounter  Received notification from [facility] that prior authorization for PANTOPRAZOLE 40MG  is required.   PA submitted on 6.4.24 Key [id number] Status is pending

## 2023-05-23 NOTE — Telephone Encounter (Signed)
Patient has try/failed Omeprazole capsule (Prilosec OTC) and Esomeprazole capsule (Nexium OTC)

## 2023-05-26 NOTE — Telephone Encounter (Signed)
Patient Advocate Encounter  Prior Authorization for Pantoprazole Sodium 40MG  dr tablets has been approved through [employer].    Key: [id number]  Effective: 05-26-2023 to 05-25-2024

## 2023-05-26 NOTE — Telephone Encounter (Signed)
Noted  

## 2023-06-29 ENCOUNTER — Other Ambulatory Visit: Payer: Self-pay | Admitting: Gastroenterology

## 2023-07-27 ENCOUNTER — Other Ambulatory Visit: Payer: Self-pay | Admitting: Gastroenterology

## 2023-07-31 ENCOUNTER — Other Ambulatory Visit: Payer: Self-pay | Admitting: Gastroenterology

## 2023-07-31 NOTE — Telephone Encounter (Signed)
PATIENT NEEDS APPT
